# Patient Record
Sex: Male | Born: 1950 | Race: White | Hispanic: No | Smoking: Former smoker
Health system: Southern US, Community
[De-identification: ages and names within clinical notes are randomized; demographics above are authoritative.]

## PROBLEM LIST (undated history)

## (undated) DIAGNOSIS — Q825 Congenital non-neoplastic nevus: Secondary | ICD-10-CM

## (undated) DIAGNOSIS — Z9221 Personal history of antineoplastic chemotherapy: Secondary | ICD-10-CM

## (undated) DIAGNOSIS — C801 Malignant (primary) neoplasm, unspecified: Secondary | ICD-10-CM

## (undated) DIAGNOSIS — E7800 Pure hypercholesterolemia, unspecified: Secondary | ICD-10-CM

## (undated) DIAGNOSIS — M1990 Unspecified osteoarthritis, unspecified site: Secondary | ICD-10-CM

## (undated) DIAGNOSIS — C61 Malignant neoplasm of prostate: Secondary | ICD-10-CM

## (undated) DIAGNOSIS — R519 Headache, unspecified: Secondary | ICD-10-CM

## (undated) DIAGNOSIS — T8859XA Other complications of anesthesia, initial encounter: Secondary | ICD-10-CM

## (undated) DIAGNOSIS — Z889 Allergy status to unspecified drugs, medicaments and biological substances status: Secondary | ICD-10-CM

## (undated) DIAGNOSIS — R51 Headache: Secondary | ICD-10-CM

## (undated) DIAGNOSIS — C8510 Unspecified B-cell lymphoma, unspecified site: Secondary | ICD-10-CM

## (undated) DIAGNOSIS — S8990XA Unspecified injury of unspecified lower leg, initial encounter: Secondary | ICD-10-CM

## (undated) DIAGNOSIS — Z87442 Personal history of urinary calculi: Secondary | ICD-10-CM

## (undated) DIAGNOSIS — T4145XA Adverse effect of unspecified anesthetic, initial encounter: Secondary | ICD-10-CM

## (undated) DIAGNOSIS — R7303 Prediabetes: Secondary | ICD-10-CM

## (undated) HISTORY — DX: Unspecified B-cell lymphoma, unspecified site: C85.10

## (undated) HISTORY — DX: Pure hypercholesterolemia, unspecified: E78.00

---

## 1980-06-11 HISTORY — PX: KNEE ARTHROSCOPY: SUR90

## 1984-06-11 HISTORY — PX: SHOULDER ARTHROSCOPY: SHX128

## 1991-06-12 HISTORY — PX: SHOULDER ARTHROSCOPY: SHX128

## 1995-06-12 HISTORY — PX: ROTATOR CUFF REPAIR: SHX139

## 1999-12-29 ENCOUNTER — Ambulatory Visit (HOSPITAL_COMMUNITY): Admission: RE | Admit: 1999-12-29 | Discharge: 1999-12-29 | Payer: Self-pay | Admitting: *Deleted

## 2000-03-21 ENCOUNTER — Emergency Department (HOSPITAL_COMMUNITY): Admission: EM | Admit: 2000-03-21 | Discharge: 2000-03-21 | Payer: Self-pay | Admitting: Emergency Medicine

## 2000-03-21 ENCOUNTER — Encounter: Payer: Self-pay | Admitting: Emergency Medicine

## 2000-07-14 ENCOUNTER — Encounter: Payer: Self-pay | Admitting: Emergency Medicine

## 2000-07-14 ENCOUNTER — Emergency Department (HOSPITAL_COMMUNITY): Admission: EM | Admit: 2000-07-14 | Discharge: 2000-07-14 | Payer: Self-pay | Admitting: Emergency Medicine

## 2006-06-18 ENCOUNTER — Ambulatory Visit (HOSPITAL_BASED_OUTPATIENT_CLINIC_OR_DEPARTMENT_OTHER): Admission: RE | Admit: 2006-06-18 | Discharge: 2006-06-18 | Payer: Self-pay | Admitting: Orthopedic Surgery

## 2006-12-23 ENCOUNTER — Emergency Department (HOSPITAL_COMMUNITY): Admission: EM | Admit: 2006-12-23 | Discharge: 2006-12-23 | Payer: Self-pay | Admitting: Emergency Medicine

## 2009-06-11 DIAGNOSIS — Z9221 Personal history of antineoplastic chemotherapy: Secondary | ICD-10-CM

## 2009-06-11 HISTORY — DX: Personal history of antineoplastic chemotherapy: Z92.21

## 2009-09-07 ENCOUNTER — Other Ambulatory Visit: Admission: RE | Admit: 2009-09-07 | Discharge: 2009-09-07 | Payer: Self-pay | Admitting: Otolaryngology

## 2009-09-10 ENCOUNTER — Encounter: Admission: RE | Admit: 2009-09-10 | Discharge: 2009-09-10 | Payer: Self-pay | Admitting: Otolaryngology

## 2009-09-10 DIAGNOSIS — C8510 Unspecified B-cell lymphoma, unspecified site: Secondary | ICD-10-CM

## 2009-09-10 HISTORY — DX: Unspecified B-cell lymphoma, unspecified site: C85.10

## 2009-09-14 ENCOUNTER — Ambulatory Visit: Payer: Self-pay | Admitting: Hematology and Oncology

## 2009-09-16 LAB — CBC WITH DIFFERENTIAL/PLATELET
Basophils Absolute: 0.1 10*3/uL (ref 0.0–0.1)
Eosinophils Absolute: 0.1 10*3/uL (ref 0.0–0.5)
HCT: 41 % (ref 38.4–49.9)
HGB: 14.2 g/dL (ref 13.0–17.1)
LYMPH%: 21.8 % (ref 14.0–49.0)
MCV: 93.2 fL (ref 79.3–98.0)
MONO#: 0.5 10*3/uL (ref 0.1–0.9)
MONO%: 8.3 % (ref 0.0–14.0)
NEUT#: 4.4 10*3/uL (ref 1.5–6.5)
Platelets: 344 10*3/uL (ref 140–400)

## 2009-09-16 LAB — COMPREHENSIVE METABOLIC PANEL
Albumin: 3.9 g/dL (ref 3.5–5.2)
Alkaline Phosphatase: 65 U/L (ref 39–117)
BUN: 15 mg/dL (ref 6–23)
CO2: 31 mEq/L (ref 19–32)
Glucose, Bld: 102 mg/dL — ABNORMAL HIGH (ref 70–99)
Total Bilirubin: 0.4 mg/dL (ref 0.3–1.2)

## 2009-09-16 LAB — LACTATE DEHYDROGENASE: LDH: 166 U/L (ref 94–250)

## 2009-09-19 ENCOUNTER — Ambulatory Visit (HOSPITAL_COMMUNITY): Admission: RE | Admit: 2009-09-19 | Discharge: 2009-09-19 | Payer: Self-pay | Admitting: Hematology and Oncology

## 2009-09-20 ENCOUNTER — Ambulatory Visit (HOSPITAL_COMMUNITY): Admission: RE | Admit: 2009-09-20 | Discharge: 2009-09-20 | Payer: Self-pay | Admitting: Hematology and Oncology

## 2009-09-20 ENCOUNTER — Encounter: Payer: Self-pay | Admitting: Hematology and Oncology

## 2009-09-20 LAB — PROTEIN ELECTROPHORESIS, SERUM
Albumin ELP: 56.2 % (ref 55.8–66.1)
Alpha-2-Globulin: 14.8 % — ABNORMAL HIGH (ref 7.1–11.8)
Beta Globulin: 5.8 % (ref 4.7–7.2)
Total Protein, Serum Electrophoresis: 7.2 g/dL (ref 6.0–8.3)

## 2009-09-20 LAB — SEDIMENTATION RATE: Sed Rate: 39 mm/hr — ABNORMAL HIGH (ref 0–16)

## 2009-09-22 ENCOUNTER — Ambulatory Visit (HOSPITAL_COMMUNITY): Admission: RE | Admit: 2009-09-22 | Discharge: 2009-09-22 | Payer: Self-pay | Admitting: Hematology and Oncology

## 2009-09-23 ENCOUNTER — Ambulatory Visit (HOSPITAL_COMMUNITY): Admission: RE | Admit: 2009-09-23 | Discharge: 2009-09-23 | Payer: Self-pay | Admitting: Hematology and Oncology

## 2009-09-27 LAB — HIV ANTIBODY (ROUTINE TESTING W REFLEX)

## 2009-09-27 LAB — CBC WITH DIFFERENTIAL/PLATELET
BASO%: 0.3 % (ref 0.0–2.0)
Eosinophils Absolute: 0.1 10*3/uL (ref 0.0–0.5)
LYMPH%: 14 % (ref 14.0–49.0)
MCHC: 33.9 g/dL (ref 32.0–36.0)
MONO#: 0.4 10*3/uL (ref 0.1–0.9)
NEUT#: 5.8 10*3/uL (ref 1.5–6.5)
Platelets: 399 10*3/uL (ref 140–400)
RBC: 4.74 10*6/uL (ref 4.20–5.82)
WBC: 7.4 10*3/uL (ref 4.0–10.3)
lymph#: 1 10*3/uL (ref 0.9–3.3)

## 2009-09-27 LAB — COMPREHENSIVE METABOLIC PANEL
ALT: 29 U/L (ref 0–53)
Albumin: 4.5 g/dL (ref 3.5–5.2)
CO2: 22 mEq/L (ref 19–32)
Calcium: 9.5 mg/dL (ref 8.4–10.5)
Chloride: 101 mEq/L (ref 96–112)
Glucose, Bld: 89 mg/dL (ref 70–99)
Potassium: 4.5 mEq/L (ref 3.5–5.3)
Sodium: 140 mEq/L (ref 135–145)
Total Protein: 7.3 g/dL (ref 6.0–8.3)

## 2009-09-27 LAB — HEPATITIS B CORE ANTIBODY, TOTAL: Hep B Core Total Ab: NEGATIVE

## 2009-09-27 LAB — URIC ACID: Uric Acid, Serum: 4.1 mg/dL (ref 4.0–7.8)

## 2009-09-27 LAB — HEPATITIS B CORE ANTIBODY, IGM: Hep B C IgM: NEGATIVE

## 2009-09-30 ENCOUNTER — Ambulatory Visit (HOSPITAL_COMMUNITY): Admission: RE | Admit: 2009-09-30 | Discharge: 2009-09-30 | Payer: Self-pay | Admitting: Hematology and Oncology

## 2009-10-03 ENCOUNTER — Ambulatory Visit: Payer: Self-pay | Admitting: Oncology

## 2009-10-06 ENCOUNTER — Ambulatory Visit: Payer: Self-pay | Admitting: Infectious Diseases

## 2009-10-06 ENCOUNTER — Inpatient Hospital Stay (HOSPITAL_COMMUNITY): Admission: AD | Admit: 2009-10-06 | Discharge: 2009-10-10 | Payer: Self-pay | Admitting: Otolaryngology

## 2009-10-08 ENCOUNTER — Ambulatory Visit: Payer: Self-pay | Admitting: Oncology

## 2009-10-18 ENCOUNTER — Ambulatory Visit: Payer: Self-pay | Admitting: Hematology and Oncology

## 2009-10-19 LAB — CBC WITH DIFFERENTIAL/PLATELET
Basophils Absolute: 0 10*3/uL (ref 0.0–0.1)
Eosinophils Absolute: 0 10*3/uL (ref 0.0–0.5)
HGB: 13 g/dL (ref 13.0–17.1)
LYMPH%: 12.5 % — ABNORMAL LOW (ref 14.0–49.0)
MCV: 91.9 fL (ref 79.3–98.0)
MONO%: 7.7 % (ref 0.0–14.0)
NEUT#: 6.1 10*3/uL (ref 1.5–6.5)
Platelets: 466 10*3/uL — ABNORMAL HIGH (ref 140–400)

## 2009-10-19 LAB — COMPREHENSIVE METABOLIC PANEL
Albumin: 4.2 g/dL (ref 3.5–5.2)
Alkaline Phosphatase: 74 U/L (ref 39–117)
BUN: 17 mg/dL (ref 6–23)
Glucose, Bld: 89 mg/dL (ref 70–99)
Total Bilirubin: 0.4 mg/dL (ref 0.3–1.2)

## 2009-10-21 ENCOUNTER — Ambulatory Visit (HOSPITAL_COMMUNITY): Admission: RE | Admit: 2009-10-21 | Discharge: 2009-10-21 | Payer: Self-pay | Admitting: Hematology and Oncology

## 2009-11-03 ENCOUNTER — Ambulatory Visit (HOSPITAL_COMMUNITY): Admission: RE | Admit: 2009-11-03 | Discharge: 2009-11-03 | Payer: Self-pay | Admitting: Hematology and Oncology

## 2009-11-09 LAB — CBC WITH DIFFERENTIAL/PLATELET
BASO%: 0.4 % (ref 0.0–2.0)
EOS%: 0.4 % (ref 0.0–7.0)
Eosinophils Absolute: 0 10*3/uL (ref 0.0–0.5)
LYMPH%: 12.2 % — ABNORMAL LOW (ref 14.0–49.0)
MCH: 31.3 pg (ref 27.2–33.4)
MCHC: 33 g/dL (ref 32.0–36.0)
MCV: 94.9 fL (ref 79.3–98.0)
MONO%: 7.7 % (ref 0.0–14.0)
Platelets: 315 10*3/uL (ref 140–400)
RBC: 3.9 10*6/uL — ABNORMAL LOW (ref 4.20–5.82)
RDW: 16.2 % — ABNORMAL HIGH (ref 11.0–14.6)

## 2009-11-09 LAB — COMPREHENSIVE METABOLIC PANEL
AST: 29 U/L (ref 0–37)
Alkaline Phosphatase: 61 U/L (ref 39–117)
Glucose, Bld: 104 mg/dL — ABNORMAL HIGH (ref 70–99)
Sodium: 141 mEq/L (ref 135–145)
Total Bilirubin: 0.4 mg/dL (ref 0.3–1.2)
Total Protein: 6.5 g/dL (ref 6.0–8.3)

## 2009-11-11 ENCOUNTER — Ambulatory Visit (HOSPITAL_COMMUNITY): Admission: RE | Admit: 2009-11-11 | Discharge: 2009-11-11 | Payer: Self-pay | Admitting: Hematology and Oncology

## 2009-11-28 ENCOUNTER — Ambulatory Visit: Payer: Self-pay | Admitting: Hematology and Oncology

## 2009-11-30 LAB — CBC WITH DIFFERENTIAL/PLATELET
Basophils Absolute: 0 10*3/uL (ref 0.0–0.1)
Eosinophils Absolute: 0.1 10*3/uL (ref 0.0–0.5)
LYMPH%: 12.2 % — ABNORMAL LOW (ref 14.0–49.0)
MCH: 33.2 pg (ref 27.2–33.4)
MCV: 96.2 fL (ref 79.3–98.0)
MONO%: 8.2 % (ref 0.0–14.0)
NEUT#: 4.3 10*3/uL (ref 1.5–6.5)
Platelets: 287 10*3/uL (ref 140–400)
RBC: 3.98 10*6/uL — ABNORMAL LOW (ref 4.20–5.82)

## 2009-11-30 LAB — COMPREHENSIVE METABOLIC PANEL
Alkaline Phosphatase: 51 U/L (ref 39–117)
BUN: 13 mg/dL (ref 6–23)
Glucose, Bld: 138 mg/dL — ABNORMAL HIGH (ref 70–99)
Sodium: 141 mEq/L (ref 135–145)
Total Bilirubin: 0.5 mg/dL (ref 0.3–1.2)

## 2009-12-02 ENCOUNTER — Ambulatory Visit (HOSPITAL_COMMUNITY): Admission: RE | Admit: 2009-12-02 | Discharge: 2009-12-02 | Payer: Self-pay | Admitting: Hematology and Oncology

## 2009-12-21 LAB — CBC WITH DIFFERENTIAL/PLATELET
BASO%: 0.4 % (ref 0.0–2.0)
Eosinophils Absolute: 0.1 10*3/uL (ref 0.0–0.5)
LYMPH%: 10.7 % — ABNORMAL LOW (ref 14.0–49.0)
MCHC: 33.9 g/dL (ref 32.0–36.0)
MCV: 97.7 fL (ref 79.3–98.0)
MONO#: 0.5 10*3/uL (ref 0.1–0.9)
MONO%: 9.6 % (ref 0.0–14.0)
NEUT#: 4 10*3/uL (ref 1.5–6.5)
Platelets: 337 10*3/uL (ref 140–400)
RBC: 3.75 10*6/uL — ABNORMAL LOW (ref 4.20–5.82)
RDW: 17.2 % — ABNORMAL HIGH (ref 11.0–14.6)
WBC: 5.2 10*3/uL (ref 4.0–10.3)

## 2009-12-21 LAB — COMPREHENSIVE METABOLIC PANEL
ALT: 33 U/L (ref 0–53)
Albumin: 4.1 g/dL (ref 3.5–5.2)
Alkaline Phosphatase: 51 U/L (ref 39–117)
Potassium: 4.3 mEq/L (ref 3.5–5.3)
Sodium: 141 mEq/L (ref 135–145)
Total Bilirubin: 0.5 mg/dL (ref 0.3–1.2)
Total Protein: 6.8 g/dL (ref 6.0–8.3)

## 2009-12-23 ENCOUNTER — Ambulatory Visit (HOSPITAL_COMMUNITY): Admission: RE | Admit: 2009-12-23 | Discharge: 2009-12-23 | Payer: Self-pay | Admitting: Oncology

## 2010-01-09 ENCOUNTER — Ambulatory Visit: Payer: Self-pay | Admitting: Hematology and Oncology

## 2010-01-11 LAB — COMPREHENSIVE METABOLIC PANEL
ALT: 27 U/L (ref 0–53)
Albumin: 4.3 g/dL (ref 3.5–5.2)
CO2: 24 mEq/L (ref 19–32)
Glucose, Bld: 141 mg/dL — ABNORMAL HIGH (ref 70–99)
Potassium: 4.3 mEq/L (ref 3.5–5.3)
Sodium: 140 mEq/L (ref 135–145)
Total Bilirubin: 0.4 mg/dL (ref 0.3–1.2)
Total Protein: 6.4 g/dL (ref 6.0–8.3)

## 2010-01-11 LAB — CBC WITH DIFFERENTIAL/PLATELET
BASO%: 0.2 % (ref 0.0–2.0)
Eosinophils Absolute: 0 10*3/uL (ref 0.0–0.5)
LYMPH%: 11.9 % — ABNORMAL LOW (ref 14.0–49.0)
MCHC: 34 g/dL (ref 32.0–36.0)
MONO#: 0.4 10*3/uL (ref 0.1–0.9)
NEUT#: 3.9 10*3/uL (ref 1.5–6.5)
RBC: 3.74 10*6/uL — ABNORMAL LOW (ref 4.20–5.82)
RDW: 15.1 % — ABNORMAL HIGH (ref 11.0–14.6)
WBC: 4.9 10*3/uL (ref 4.0–10.3)
lymph#: 0.6 10*3/uL — ABNORMAL LOW (ref 0.9–3.3)

## 2010-01-13 ENCOUNTER — Ambulatory Visit (HOSPITAL_COMMUNITY): Admission: RE | Admit: 2010-01-13 | Discharge: 2010-01-13 | Payer: Self-pay | Admitting: Hematology and Oncology

## 2010-02-08 ENCOUNTER — Ambulatory Visit (HOSPITAL_COMMUNITY): Admission: RE | Admit: 2010-02-08 | Discharge: 2010-02-08 | Payer: Self-pay | Admitting: Hematology and Oncology

## 2010-02-09 ENCOUNTER — Ambulatory Visit: Payer: Self-pay | Admitting: Hematology and Oncology

## 2010-02-09 LAB — CBC WITH DIFFERENTIAL/PLATELET
EOS%: 1.5 % (ref 0.0–7.0)
Eosinophils Absolute: 0.1 10*3/uL (ref 0.0–0.5)
LYMPH%: 12.4 % — ABNORMAL LOW (ref 14.0–49.0)
MCH: 33.8 pg — ABNORMAL HIGH (ref 27.2–33.4)
MCHC: 34.1 g/dL (ref 32.0–36.0)
MCV: 99.2 fL — ABNORMAL HIGH (ref 79.3–98.0)
MONO%: 10.7 % (ref 0.0–14.0)
NEUT#: 2.8 10*3/uL (ref 1.5–6.5)
Platelets: 290 10*3/uL (ref 140–400)
RBC: 3.78 10*6/uL — ABNORMAL LOW (ref 4.20–5.82)
RDW: 14.3 % (ref 11.0–14.6)

## 2010-02-09 LAB — COMPREHENSIVE METABOLIC PANEL
AST: 41 U/L — ABNORMAL HIGH (ref 0–37)
Alkaline Phosphatase: 53 U/L (ref 39–117)
Glucose, Bld: 115 mg/dL — ABNORMAL HIGH (ref 70–99)
Sodium: 142 mEq/L (ref 135–145)
Total Bilirubin: 0.4 mg/dL (ref 0.3–1.2)
Total Protein: 6.4 g/dL (ref 6.0–8.3)

## 2010-02-09 LAB — LACTATE DEHYDROGENASE: LDH: 179 U/L (ref 94–250)

## 2010-05-08 ENCOUNTER — Ambulatory Visit: Payer: Self-pay | Admitting: Hematology and Oncology

## 2010-05-08 ENCOUNTER — Ambulatory Visit (HOSPITAL_COMMUNITY)
Admission: RE | Admit: 2010-05-08 | Discharge: 2010-05-08 | Payer: Self-pay | Source: Home / Self Care | Admitting: Hematology and Oncology

## 2010-05-08 LAB — LACTATE DEHYDROGENASE: LDH: 154 U/L (ref 94–250)

## 2010-05-08 LAB — COMPREHENSIVE METABOLIC PANEL
Albumin: 4.2 g/dL (ref 3.5–5.2)
Alkaline Phosphatase: 64 U/L (ref 39–117)
CO2: 25 mEq/L (ref 19–32)
Calcium: 9.5 mg/dL (ref 8.4–10.5)
Chloride: 104 mEq/L (ref 96–112)
Glucose, Bld: 96 mg/dL (ref 70–99)
Potassium: 4.1 mEq/L (ref 3.5–5.3)
Sodium: 138 mEq/L (ref 135–145)
Total Protein: 7.2 g/dL (ref 6.0–8.3)

## 2010-05-08 LAB — CBC WITH DIFFERENTIAL/PLATELET
Basophils Absolute: 0 10*3/uL (ref 0.0–0.1)
Eosinophils Absolute: 0.1 10*3/uL (ref 0.0–0.5)
HGB: 14.4 g/dL (ref 13.0–17.1)
MONO#: 0.5 10*3/uL (ref 0.1–0.9)
NEUT#: 3 10*3/uL (ref 1.5–6.5)
RBC: 4.45 10*6/uL (ref 4.20–5.82)
RDW: 13.2 % (ref 11.0–14.6)
WBC: 4.4 10*3/uL (ref 4.0–10.3)
lymph#: 0.8 10*3/uL — ABNORMAL LOW (ref 0.9–3.3)

## 2010-06-30 ENCOUNTER — Other Ambulatory Visit: Payer: Self-pay | Admitting: Hematology and Oncology

## 2010-06-30 DIAGNOSIS — C8580 Other specified types of non-Hodgkin lymphoma, unspecified site: Secondary | ICD-10-CM

## 2010-07-01 ENCOUNTER — Encounter: Payer: Self-pay | Admitting: Hematology and Oncology

## 2010-08-25 LAB — GLUCOSE, CAPILLARY: Glucose-Capillary: 113 mg/dL — ABNORMAL HIGH (ref 70–99)

## 2010-08-28 LAB — GLUCOSE, CAPILLARY: Glucose-Capillary: 113 mg/dL — ABNORMAL HIGH (ref 70–99)

## 2010-08-29 LAB — GRAM STAIN

## 2010-08-29 LAB — CBC
HCT: 33.5 % — ABNORMAL LOW (ref 39.0–52.0)
HCT: 33.5 % — ABNORMAL LOW (ref 39.0–52.0)
HCT: 36.7 % — ABNORMAL LOW (ref 39.0–52.0)
Hemoglobin: 11.5 g/dL — ABNORMAL LOW (ref 13.0–17.0)
MCHC: 35 g/dL (ref 30.0–36.0)
MCHC: 34.7 g/dL (ref 30.0–36.0)
MCHC: 35 g/dL (ref 30.0–36.0)
MCV: 92.7 fL (ref 78.0–100.0)
MCV: 92.7 fL (ref 78.0–100.0)
Platelets: 245 10*3/uL (ref 150–400)
RBC: 3.58 MIL/uL — ABNORMAL LOW (ref 4.22–5.81)
RBC: 3.62 MIL/uL — ABNORMAL LOW (ref 4.22–5.81)
RBC: 3.58 MIL/uL — ABNORMAL LOW (ref 4.22–5.81)
RBC: 3.75 MIL/uL — ABNORMAL LOW (ref 4.22–5.81)
RDW: 13.1 % (ref 11.5–15.5)
RDW: 13.2 % (ref 11.5–15.5)
WBC: 7.8 10*3/uL (ref 4.0–10.5)
WBC: 4.8 10*3/uL (ref 4.0–10.5)

## 2010-08-29 LAB — COMPREHENSIVE METABOLIC PANEL
ALT: 184 U/L — ABNORMAL HIGH (ref 0–53)
AST: 89 U/L — ABNORMAL HIGH (ref 0–37)
Albumin: 3.5 g/dL (ref 3.5–5.2)
BUN: 11 mg/dL (ref 6–23)
CO2: 28 mEq/L (ref 19–32)
Calcium: 8.2 mg/dL — ABNORMAL LOW (ref 8.4–10.5)
Calcium: 9.1 mg/dL (ref 8.4–10.5)
Chloride: 101 mEq/L (ref 96–112)
Creatinine, Ser: 0.93 mg/dL (ref 0.4–1.5)
GFR calc Af Amer: >60 mL/min (ref >60)
GFR calc Af Amer: >60 mL/min (ref >60)
GFR calc non Af Amer: >60 mL/min (ref >60)
Sodium: 134 mEq/L — ABNORMAL LOW (ref 135–145)
Total Bilirubin: 0.4 mg/dL (ref 0.3–1.2)
Total Bilirubin: 0.7 mg/dL (ref 0.3–1.2)
Total Protein: 7 g/dL (ref 6.0–8.3)

## 2010-08-29 LAB — WOUND CULTURE: Gram Stain: NONE SEEN

## 2010-08-29 LAB — CULTURE, BLOOD (ROUTINE X 2)
Culture: NO GROWTH
Culture: NO GROWTH

## 2010-08-29 LAB — DIFFERENTIAL
Basophils Absolute: 0 10*3/uL (ref 0.0–0.1)
Basophils Relative: 0 % (ref 0–1)
Basophils Relative: 1 % (ref 0–1)
Eosinophils Absolute: 0 10*3/uL (ref 0.0–0.7)
Eosinophils Relative: 1 % (ref 0–5)
Lymphocytes Relative: 13 % (ref 12–46)
Monocytes Relative: 10 % (ref 3–12)
Monocytes Relative: 5 % (ref 3–12)
Neutro Abs: 1.9 10*3/uL (ref 1.7–7.7)
Neutrophils Relative %: 66 % (ref 43–77)
Neutrophils Relative %: 81 % — ABNORMAL HIGH (ref 43–77)

## 2010-08-29 LAB — URINALYSIS, ROUTINE W REFLEX MICROSCOPIC
Nitrite: NEGATIVE
Specific Gravity, Urine: 1.016 (ref 1.005–1.030)
Urobilinogen, UA: 1 mg/dL (ref 0.0–1.0)

## 2010-08-29 LAB — CULTURE, ROUTINE-SINUS

## 2010-08-30 LAB — PROTIME-INR: INR: 1.07 (ref 0.00–1.49)

## 2010-08-30 LAB — CBC
Hemoglobin: 14.6 g/dL (ref 13.0–17.0)
MCHC: 34.2 g/dL (ref 30.0–36.0)
MCV: 93.4 fL (ref 78.0–100.0)
RDW: 12.8 % (ref 11.5–15.5)

## 2010-09-11 ENCOUNTER — Other Ambulatory Visit (HOSPITAL_COMMUNITY): Payer: Self-pay

## 2010-09-11 ENCOUNTER — Other Ambulatory Visit: Payer: Self-pay | Admitting: Hematology and Oncology

## 2010-09-11 ENCOUNTER — Encounter (HOSPITAL_BASED_OUTPATIENT_CLINIC_OR_DEPARTMENT_OTHER): Payer: PRIVATE HEALTH INSURANCE | Admitting: Hematology and Oncology

## 2010-09-11 ENCOUNTER — Ambulatory Visit (HOSPITAL_COMMUNITY)
Admission: RE | Admit: 2010-09-11 | Discharge: 2010-09-11 | Disposition: A | Discharge status: Home / Self Care | Payer: PRIVATE HEALTH INSURANCE | Source: Ambulatory Visit | Attending: Hematology and Oncology | Admitting: Hematology and Oncology

## 2010-09-11 ENCOUNTER — Encounter (HOSPITAL_COMMUNITY): Payer: Self-pay

## 2010-09-11 DIAGNOSIS — C8581 Other specified types of non-Hodgkin lymphoma, lymph nodes of head, face, and neck: Secondary | ICD-10-CM

## 2010-09-11 DIAGNOSIS — N400 Enlarged prostate without lower urinary tract symptoms: Secondary | ICD-10-CM | POA: Insufficient documentation

## 2010-09-11 DIAGNOSIS — N62 Hypertrophy of breast: Secondary | ICD-10-CM | POA: Insufficient documentation

## 2010-09-11 DIAGNOSIS — Z09 Encounter for follow-up examination after completed treatment for conditions other than malignant neoplasm: Secondary | ICD-10-CM | POA: Insufficient documentation

## 2010-09-11 DIAGNOSIS — Z23 Encounter for immunization: Secondary | ICD-10-CM

## 2010-09-11 DIAGNOSIS — R911 Solitary pulmonary nodule: Secondary | ICD-10-CM | POA: Insufficient documentation

## 2010-09-11 DIAGNOSIS — C8580 Other specified types of non-Hodgkin lymphoma, unspecified site: Secondary | ICD-10-CM

## 2010-09-11 DIAGNOSIS — C8589 Other specified types of non-Hodgkin lymphoma, extranodal and solid organ sites: Secondary | ICD-10-CM | POA: Insufficient documentation

## 2010-09-11 HISTORY — DX: Malignant (primary) neoplasm, unspecified: C80.1

## 2010-09-11 LAB — CMP (CANCER CENTER ONLY)
AST: 32 U/L (ref 11–38)
Alkaline Phosphatase: 59 U/L (ref 26–84)
Glucose, Bld: 113 mg/dL (ref 73–118)
Sodium: 141 mEq/L (ref 128–145)
Total Bilirubin: 0.7 mg/dl (ref 0.20–1.60)
Total Protein: 6.7 g/dL (ref 6.4–8.1)

## 2010-09-11 LAB — CBC WITH DIFFERENTIAL/PLATELET
Basophils Absolute: 0 10*3/uL (ref 0.0–0.1)
Eosinophils Absolute: 0.1 10*3/uL (ref 0.0–0.5)
HCT: 39.7 % (ref 38.4–49.9)
HGB: 13.7 g/dL (ref 13.0–17.1)
LYMPH%: 20.4 % (ref 14.0–49.0)
MCHC: 34.5 g/dL (ref 32.0–36.0)
MONO#: 0.3 10*3/uL (ref 0.1–0.9)
NEUT#: 2.3 10*3/uL (ref 1.5–6.5)
NEUT%: 67 % (ref 39.0–75.0)
Platelets: 248 10*3/uL (ref 140–400)
WBC: 3.4 10*3/uL — ABNORMAL LOW (ref 4.0–10.3)
lymph#: 0.7 10*3/uL — ABNORMAL LOW (ref 0.9–3.3)

## 2010-09-11 MED ORDER — IOHEXOL 300 MG/ML  SOLN
125.0000 mL | Freq: Once | INTRAMUSCULAR | Status: AC | PRN
  Administered 2010-09-11: 125 mL via INTRAVENOUS

## 2010-09-12 ENCOUNTER — Encounter (HOSPITAL_BASED_OUTPATIENT_CLINIC_OR_DEPARTMENT_OTHER): Payer: PRIVATE HEALTH INSURANCE | Admitting: Hematology and Oncology

## 2010-09-12 ENCOUNTER — Other Ambulatory Visit: Payer: Self-pay | Admitting: Hematology and Oncology

## 2010-09-12 DIAGNOSIS — C8581 Other specified types of non-Hodgkin lymphoma, lymph nodes of head, face, and neck: Secondary | ICD-10-CM

## 2010-09-12 DIAGNOSIS — C8590 Non-Hodgkin lymphoma, unspecified, unspecified site: Secondary | ICD-10-CM

## 2010-10-27 NOTE — Procedures (Signed)
[place]  Patient:    Ian Park, Ian Park                        MRN: [id number] Proc. Date: 12/29/99 Adm. Date:  [id number] Attending:  Mingo Amber CC:         Anna Genre Little, M.D.                           Procedure Report  PROCEDURES:  Video colonoscopy.  INDICATIONS:  A 60 year old male with recurrent hematochezia and rectal discomfort.  PREPARATION:  Patient is n.p.o. since midnight, having taken Phospho-Soda prep and a clear liquid diet yesterday.  The mucosa throughout was clean.  Depth of insertion: cecum.  PREPROCEDURE SEDATION:  He received a total of 50 mg of Demerol and 5 mg of Versed intravenously.  In addition, he was on 2 L of nasal cannula O2.  PROCEDURE:  The Olympus video colonoscope was inserted via the rectum and advanced easily through the entire colon to the cecum.  Cecal landmarks were identified and photographed.  On withdrawal, the mucosa was carefully evaluated and found to be entirely normal from cecum to rectum.  Retroflexed view of the rectum was unremarkable.  On withdrawal through the anal canal, where moderate external hemorrhoids were noted.  There was no other pathology seen.  Patient tolerated the procedure well.  Pulse, blood pressure and oximetry testing were stable throughout.  He was observed in recovery for 45 minutes and discharged home alert with a benign abdomen.  IMPRESSION:  Rectal bleeding seemed secondary only to external hemorrhoids, which should be managed symptomatically, unless the patients discomfort worsens significantly.  A high-fiber diet, Sitz baths p.r.n., Anusol cream or suppositories and Tucks pads were recommended. DD:  12/29/99 TD:  12/30/99 Job: [id number]

## 2010-10-27 NOTE — Op Note (Signed)
NAME:  Ian Park, Ian Park                 ACCOUNT NO.:  [id number]   MEDICAL RECORD NO.:  [id number]          PATIENT TYPE:  AMB   LOCATION:  DSC                          FACILITY:  [facility]   PHYSICIAN:  Katy Fitch. Sypher, M.D. DATE OF BIRTH:  July 06, 1950   DATE OF PROCEDURE:  06/18/2006  DATE OF DISCHARGE:                               OPERATIVE REPORT   PREOPERATIVE DIAGNOSIS:  Chronic pain right shoulder due to a number of  factors, including acromioclavicular arthritis and sub-acromioclavicular  joint impingement, with chronic bursitis, rule out recurrent rotator  cuff tear.   POSTOPERATIVE DIAGNOSES:  1. Extensive labral degenerative tearing and loose body within      glenohumeral joint, and early adhesive capsulitis right shoulder,      as well as sub-acromioclavicular  impingement.  2. There was no sign of a recurrent rotator cuff tear.   OPERATIONS:  1. Examination of right shoulder under anesthesia.  2. Arthroscopic debridement of labral tear, removal of loose body, and      debridement of adhesive capsulitis granulation tissue.  3. Subacromial debridement, with tenolysis of rotator cuff tendons.      subacromial decompression, with acromioplasty, coracoacromial      ligament relaxation.  4. Arthroscopic removal of distal clavicle.   OPERATING SURGEON:  Katy Fitch. Sypher, M.D.   ASSISTANT:  Marveen Reeks. Dasnoit PA-C.   ANESTHESIA:  General endotracheal, supplemented by interscalene block.   SUPERVISING ANESTHESIOLOGIST:  Germaine Pomfret, M.D.   INDICATIONS:  Ian Park is a 60 year old gentleman referred through  the courtesy of Dr. Catha Gosselin for evaluation and management of a  painful right shoulder.   Ian Park had undergone prior rotator cuff repair by Dr. Chaney Malling on the  left in 1986, on the right in 1994.   His left shoulder was still reasonably comfortable.  His right shoulder  had become increasingly painful.  He presented for an upper extremity  orthopedic evaluation.  Clinical examination revealed mild anterior  deltoid atrophy, perhaps due to postoperative technical issues.   He had AC degenerative change with a prominent inferior clavicle and a  prominent medial acromion.   He had had a generous anterior lateral acromioplasty performed at the  time of his prior rotator cuff repair.   Ian Park was sent for an MRI which revealed evidence of some labral  degenerative changes, AC arthropathy, a prominent distal clavicle, and  abnormal signal in the rotator cuff.   We advised him to proceed with examination of his shoulder under  anesthesia, anticipating arthroscopic evaluation of the rotator cuff,  appropriate intraarticular debridement, subacromial decompression,  medial acromial removal, and distal clavicle resection.   After informed consent, he is brought to the operating room at this  time.   PROCEDURE:  Ian Park was brought to the operating room and placed  in a supine position on the operating table.  Dr. Jean Rosenthal had performed  an anesthesia consult in the holding area.  An interscalene block was  placed without complication by Dr. Jean Rosenthal.   Following the induction of general endotracheal anesthesia under  Dr.  Edison Pace direct supervision, Ian Park was carefully positioned in the  beach-chair position with aid of a torso and head holder designed for  shoulder arthroscopy.  Pneumatic compression devices were applied to the  calves for deep vein thrombosis prevention, and the entire right upper  extremity was prepped with DuraPrep and draped with impervious  arthroscopy drapes.   Examination of the shoulder under anesthesia revealed elevation 175  degrees combined, external rotation 90 degrees, internal rotation of 80  degrees. The glenohumeral joint was stable.  There appeared to be sub-AC  impingement, with a click noted.   The arm was then prepped DuraPrep and draped with impervious arthroscopy  drapes.   The shoulder was distended with 20 cc of sterile saline with a  spinal needle brought in posteriorly.  The scope was placed  atraumatically through the posterior soft spot.  Diagnostic arthroscopy  revealed extensive fraying of the labrum and granulation tissue in the  subscapularis recess, consistent with an early adhesive capsulitis.  There was a Buford complex type the anterior superior glenohumeral  ligament.  This was obliterated with the granulation tissue and frayed  fragments of capsule and hypertrophic synovitis.   An anterior portal was created under direct vision with a blunt trocar,  followed by use of a 4.5 mm suction shaver to debride the labrum to a  stable margin.  A 6 x 5 mm loose body was identified that was ultimately  removed by suction from the joint with the suction shaver.  A complete  synovectomy was accomplished, followed by inspection of the biceps  origin.  This was noted be stable at the glenoid, and the biceps was  intact through the rotator interval.  The subscapularis had some  degenerative tendinopathy.  The humeral head had areas of grade 3  chondromalacia.  The inferior labrum was degenerative, and the inferior  glenoid had an area of full-thickness chondromalacia.   Several fragments of the labrum were removed with the suction shaver.  The inferior recess was inspected and found be slightly inflamed but  otherwise normal.   The prior rotator cuff repair was inspected.  The footprint of the  supraspinatus and infraspinatus was not normal on the greater  tuberosity.  There was a recess of approximately 5-6 mm where the repair  was not anatomic with respect its medial footprint. However, the tendon  appeared to be adequately attached to the greater tuberosity to avoid a  second effort at repair.   After removal of the scope from the glenohumeral joint, the scope was placed in the subacromial space.  A florid bursitis obscured view.  After bursectomy,  the anatomy of the coracoacromial arch was studied.  The medial acromion and distal clavicle were quite prominent.  There had  been a prior generous anterior acromionectomy. However, there was still  small anterior lip within the deltoid.  After the bursa was taken down,  exposing the deltoid insertion anteriorly, the suction shaver and bur  were used to the level of the acromion to a type 1 morphology.  The  medial acromion at the [facility] joint was leveled to a type 1 morphology, and  the distal 15 mm of clavicle was removed arthroscopically.   A dense scar at the site of the reformed coracoacromial ligament was  released with the cutting cautery and partially debrided with the  suction shaver.   A complete tenolysis of the rotator cuff was accomplished, removing  hypertrophic bursa and remnants of scar  on the deep surface of the  acromion.   After complete decompression, it was my judgment that repair of the  rotator cuff would not be necessary.  Hemostasis was achieved with  bipolar cautery, and the periosteum along the margin of the distal  clavicle resection was electrocauterized with the cutting cautery.   The arthroscopic equipment was removed after photographic documentation  of the decompression.  There were no apparent complications.   Ian Park tolerated the surgery and anesthesia well.   He was awakened from general anesthesia and transferred to the recovery  room with stable signs vital signs, with a sling supporting the right  arm.   If Ian Park is noted to be comfortable and stable in the postanesthesia  care unit, he will be discharged home and may return to our office in 24  hours for dressing change and initiation of his range of motion program.      Katy Fitch. Sypher, M.D.  Electronically Signed     RVS/MEDQ  D:  06/18/2006  T:  06/18/2006  Job:  [id number]   cc:   Caryn Bee L. Little, M.D.

## 2010-11-02 ENCOUNTER — Other Ambulatory Visit: Payer: Self-pay | Admitting: Family Medicine

## 2010-11-02 DIAGNOSIS — N644 Mastodynia: Secondary | ICD-10-CM

## 2010-11-07 ENCOUNTER — Ambulatory Visit
Admission: RE | Admit: 2010-11-07 | Discharge: 2010-11-07 | Disposition: A | Discharge status: Home / Self Care | Payer: PRIVATE HEALTH INSURANCE | Source: Ambulatory Visit | Attending: Family Medicine | Admitting: Family Medicine

## 2010-11-07 ENCOUNTER — Other Ambulatory Visit: Payer: Self-pay | Admitting: Family Medicine

## 2010-11-07 DIAGNOSIS — N644 Mastodynia: Secondary | ICD-10-CM

## 2011-01-19 ENCOUNTER — Inpatient Hospital Stay (HOSPITAL_COMMUNITY)
Admission: RE | Admit: 2011-01-19 | Discharge: 2011-01-19 | Payer: PRIVATE HEALTH INSURANCE | Source: Ambulatory Visit | Attending: Hematology and Oncology | Admitting: Hematology and Oncology

## 2011-01-22 ENCOUNTER — Encounter (HOSPITAL_COMMUNITY): Payer: Self-pay

## 2011-01-22 ENCOUNTER — Ambulatory Visit (HOSPITAL_COMMUNITY)
Admission: RE | Admit: 2011-01-22 | Discharge: 2011-01-22 | Disposition: A | Discharge status: Home / Self Care | Payer: PRIVATE HEALTH INSURANCE | Source: Ambulatory Visit | Attending: Hematology and Oncology | Admitting: Hematology and Oncology

## 2011-01-22 ENCOUNTER — Encounter (HOSPITAL_BASED_OUTPATIENT_CLINIC_OR_DEPARTMENT_OTHER): Payer: PRIVATE HEALTH INSURANCE | Admitting: Hematology and Oncology

## 2011-01-22 ENCOUNTER — Other Ambulatory Visit: Payer: Self-pay | Admitting: Hematology and Oncology

## 2011-01-22 DIAGNOSIS — R911 Solitary pulmonary nodule: Secondary | ICD-10-CM | POA: Insufficient documentation

## 2011-01-22 DIAGNOSIS — C8581 Other specified types of non-Hodgkin lymphoma, lymph nodes of head, face, and neck: Secondary | ICD-10-CM

## 2011-01-22 DIAGNOSIS — Z9221 Personal history of antineoplastic chemotherapy: Secondary | ICD-10-CM | POA: Insufficient documentation

## 2011-01-22 DIAGNOSIS — C8589 Other specified types of non-Hodgkin lymphoma, extranodal and solid organ sites: Secondary | ICD-10-CM | POA: Insufficient documentation

## 2011-01-22 DIAGNOSIS — N400 Enlarged prostate without lower urinary tract symptoms: Secondary | ICD-10-CM | POA: Insufficient documentation

## 2011-01-22 DIAGNOSIS — K5730 Diverticulosis of large intestine without perforation or abscess without bleeding: Secondary | ICD-10-CM | POA: Insufficient documentation

## 2011-01-22 DIAGNOSIS — N200 Calculus of kidney: Secondary | ICD-10-CM | POA: Insufficient documentation

## 2011-01-22 DIAGNOSIS — C8590 Non-Hodgkin lymphoma, unspecified, unspecified site: Secondary | ICD-10-CM

## 2011-01-22 LAB — CBC WITH DIFFERENTIAL/PLATELET
Eosinophils Absolute: 0.1 10*3/uL (ref 0.0–0.5)
HCT: 42.2 % (ref 38.4–49.9)
LYMPH%: 22.9 % (ref 14.0–49.0)
MCV: 93.9 fL (ref 79.3–98.0)
MONO#: 0.4 10*3/uL (ref 0.1–0.9)
NEUT#: 3.2 10*3/uL (ref 1.5–6.5)
NEUT%: 67.2 % (ref 39.0–75.0)
Platelets: 284 10*3/uL (ref 140–400)
WBC: 4.7 10*3/uL (ref 4.0–10.3)

## 2011-01-22 LAB — CMP (CANCER CENTER ONLY)
BUN, Bld: 13 mg/dL (ref 7–22)
CO2: 28 mEq/L (ref 18–33)
Creat: 0.9 mg/dl (ref 0.6–1.2)
Glucose, Bld: 107 mg/dL (ref 73–118)
Total Bilirubin: 0.8 mg/dl (ref 0.20–1.60)

## 2011-01-22 LAB — PSA: PSA: 3.76 ng/mL (ref <=4.00)

## 2011-01-22 LAB — LACTATE DEHYDROGENASE: LDH: 164 U/L (ref 94–250)

## 2011-01-22 MED ORDER — IOHEXOL 300 MG/ML  SOLN
100.0000 mL | Freq: Once | INTRAMUSCULAR | Status: AC | PRN
  Administered 2011-01-22: 100 mL via INTRAVENOUS

## 2011-01-23 ENCOUNTER — Encounter (HOSPITAL_BASED_OUTPATIENT_CLINIC_OR_DEPARTMENT_OTHER): Payer: PRIVATE HEALTH INSURANCE | Admitting: Hematology and Oncology

## 2011-01-23 ENCOUNTER — Other Ambulatory Visit: Payer: Self-pay | Admitting: Hematology and Oncology

## 2011-01-23 DIAGNOSIS — C8590 Non-Hodgkin lymphoma, unspecified, unspecified site: Secondary | ICD-10-CM

## 2011-01-23 DIAGNOSIS — C8581 Other specified types of non-Hodgkin lymphoma, lymph nodes of head, face, and neck: Secondary | ICD-10-CM

## 2011-02-23 ENCOUNTER — Encounter (HOSPITAL_BASED_OUTPATIENT_CLINIC_OR_DEPARTMENT_OTHER): Payer: PRIVATE HEALTH INSURANCE | Admitting: Hematology and Oncology

## 2011-02-23 ENCOUNTER — Other Ambulatory Visit: Payer: Self-pay | Admitting: Hematology and Oncology

## 2011-02-23 DIAGNOSIS — C8581 Other specified types of non-Hodgkin lymphoma, lymph nodes of head, face, and neck: Secondary | ICD-10-CM

## 2011-02-23 DIAGNOSIS — N644 Mastodynia: Secondary | ICD-10-CM

## 2011-02-23 DIAGNOSIS — C8589 Other specified types of non-Hodgkin lymphoma, extranodal and solid organ sites: Secondary | ICD-10-CM

## 2011-02-23 LAB — CBC WITH DIFFERENTIAL/PLATELET
Basophils Absolute: 0 10*3/uL (ref 0.0–0.1)
EOS%: 2.4 % (ref 0.0–7.0)
Eosinophils Absolute: 0.1 10*3/uL (ref 0.0–0.5)
HGB: 14.3 g/dL (ref 13.0–17.1)
MCH: 32.4 pg (ref 27.2–33.4)
NEUT#: 2.6 10*3/uL (ref 1.5–6.5)
RDW: 13.2 % (ref 11.0–14.6)
WBC: 3.8 10*3/uL — ABNORMAL LOW (ref 4.0–10.3)
lymph#: 0.8 10*3/uL — ABNORMAL LOW (ref 0.9–3.3)

## 2011-02-23 LAB — COMPREHENSIVE METABOLIC PANEL
AST: 20 U/L (ref 0–37)
Albumin: 4.2 g/dL (ref 3.5–5.2)
BUN: 17 mg/dL (ref 6–23)
Calcium: 9.6 mg/dL (ref 8.4–10.5)
Chloride: 107 mEq/L (ref 96–112)
Potassium: 4.2 mEq/L (ref 3.5–5.3)

## 2011-04-11 ENCOUNTER — Other Ambulatory Visit: Payer: Self-pay | Admitting: Gastroenterology

## 2011-05-19 ENCOUNTER — Encounter: Payer: Self-pay | Admitting: *Deleted

## 2011-05-21 ENCOUNTER — Other Ambulatory Visit: Payer: Self-pay | Admitting: Hematology and Oncology

## 2011-05-21 ENCOUNTER — Ambulatory Visit (HOSPITAL_COMMUNITY)
Admission: RE | Admit: 2011-05-21 | Discharge: 2011-05-21 | Disposition: A | Discharge status: Home / Self Care | Payer: No Typology Code available for payment source | Source: Ambulatory Visit | Attending: Hematology and Oncology | Admitting: Hematology and Oncology

## 2011-05-21 ENCOUNTER — Other Ambulatory Visit (HOSPITAL_BASED_OUTPATIENT_CLINIC_OR_DEPARTMENT_OTHER): Payer: PRIVATE HEALTH INSURANCE | Admitting: Lab

## 2011-05-21 DIAGNOSIS — C8589 Other specified types of non-Hodgkin lymphoma, extranodal and solid organ sites: Secondary | ICD-10-CM | POA: Insufficient documentation

## 2011-05-21 DIAGNOSIS — C8581 Other specified types of non-Hodgkin lymphoma, lymph nodes of head, face, and neck: Secondary | ICD-10-CM

## 2011-05-21 DIAGNOSIS — C8590 Non-Hodgkin lymphoma, unspecified, unspecified site: Secondary | ICD-10-CM

## 2011-05-21 DIAGNOSIS — Z9221 Personal history of antineoplastic chemotherapy: Secondary | ICD-10-CM | POA: Insufficient documentation

## 2011-05-21 LAB — CBC WITH DIFFERENTIAL/PLATELET
BASO%: 0.7 % (ref 0.0–2.0)
EOS%: 1.4 % (ref 0.0–7.0)
LYMPH%: 17.4 % (ref 14.0–49.0)
MCHC: 34.3 g/dL (ref 32.0–36.0)
MCV: 93.9 fL (ref 79.3–98.0)
MONO%: 6.2 % (ref 0.0–14.0)
Platelets: 255 10*3/uL (ref 140–400)
RBC: 4.49 10*6/uL (ref 4.20–5.82)

## 2011-05-21 LAB — CMP (CANCER CENTER ONLY)
ALT(SGPT): 25 U/L (ref 10–47)
AST: 24 U/L (ref 11–38)
Creat: 1.2 mg/dl (ref 0.6–1.2)
Total Bilirubin: 0.6 mg/dl (ref 0.20–1.60)

## 2011-05-21 LAB — LACTATE DEHYDROGENASE: LDH: 179 U/L (ref 94–250)

## 2011-05-21 MED ORDER — IOHEXOL 300 MG/ML  SOLN
100.0000 mL | Freq: Once | INTRAMUSCULAR | Status: AC | PRN
  Administered 2011-05-21: 100 mL via INTRAVENOUS

## 2011-05-22 ENCOUNTER — Ambulatory Visit (HOSPITAL_BASED_OUTPATIENT_CLINIC_OR_DEPARTMENT_OTHER): Payer: PRIVATE HEALTH INSURANCE | Admitting: Hematology and Oncology

## 2011-05-22 ENCOUNTER — Telehealth: Payer: Self-pay | Admitting: Hematology and Oncology

## 2011-05-22 VITALS — BP 129/78 | HR 75 | Temp 98.4°F | Ht 67.0 in | Wt 229.8 lb

## 2011-05-22 DIAGNOSIS — C8581 Other specified types of non-Hodgkin lymphoma, lymph nodes of head, face, and neck: Secondary | ICD-10-CM

## 2011-05-22 DIAGNOSIS — C8590 Non-Hodgkin lymphoma, unspecified, unspecified site: Secondary | ICD-10-CM

## 2011-05-22 NOTE — Progress Notes (Signed)
CC:   Ian Park. Little, M.D. Jefry H. Pollyann Kennedy, MD  IDENTIFYING STATEMENT:  Patient is a 60 year old man with large B-cell lymphoma who presents for followup to discuss results of scans.  INTERIM HISTORY:  Ian Park has last seen in August 2012.  He has no current concerns.  He has been extensively worked up for bilateral gynecomastia.  He has no current complaints.  Denies fever, chills, night sweats, anorexia, weight loss.  He has not noted any adenopathy. He reminded me that he received a colonoscopy in September of 2012 that was unremarkable.  MEDICATIONS:  Reviewed and updated.  ALLERGIES:  None.  PAST MEDICAL HISTORY/FAMILY HISTORY/SOCIAL HISTORY:  Unchanged.  REVIEW OF SYSTEMS:  10 point review of systems negative.  PHYSICAL EXAMINATION:  The patient is a well-appearing, well-nourished man in no distress.  Vitals:  Pulse 75, blood pressure 129/78, temperature 98.4, respirations 18, weight 229.8 pounds.  HEENT:  Head is atraumatic, normocephalic.  Sclerae anicteric.  Neck:  Supple.  No adenopathy.  Chest:  Clear to percussion and auscultation.  CVS:  1st and 2nd heart sounds present.  No added sounds or murmurs.  Bilateral gynecomastia with no masses.  Abdomen:  Soft, nontender.  Bowel sounds present.  Lymph nodes:  No palpable cervical, axillary, inguinal adenopathy.  Extremities:  No calf tenderness.  Pulses present and symmetrical.  CNS:  Nonfocal.  LABORATORY DATA:  05/21/2011 white cell count 4.5, hemoglobin 14.4, hematocrit 40.1, platelets 255.  Sodium 143, potassium 4.3, chloride 102, CO2 29, BUN 12, creatinine 1.2, glucose 113, t bilirubin 0.6, alkaline phosphatase 79, AST 24, ALT 25, calcium 8.7, LDH 179.  CT scan of the neck, chest, abdomen, and pelvis obtained on 05/21/2011 showed no evidence of lymphoma recurrence within the neck.  The chest and abdomen also showed no evidence of lymphoma recurrence.  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  He is status post 6 cycles of R-CHOP alternating with intrathecal methotrexate and cytarabine from 10/07/2009 through 01/12/2010.  His exam, labs and scans continue to demonstrate no evidence of recurrence for lymphoma. He is to follow up in 6 months' time with a CT scan and blood work.      ______________________________ Laurice Record, M.D. LIO/MEDQ  D:  05/22/2011  T:  05/22/2011  Job:  [id number]

## 2011-05-22 NOTE — Telephone Encounter (Signed)
gve the pt his June 2013 appt calendar along with the ct scan appt with instructions.

## 2011-05-22 NOTE — Progress Notes (Signed)
This office note has been dictated.

## 2011-11-01 ENCOUNTER — Telehealth: Payer: Self-pay | Admitting: Hematology and Oncology

## 2011-11-01 ENCOUNTER — Other Ambulatory Visit: Payer: Self-pay | Admitting: *Deleted

## 2011-11-01 NOTE — Telephone Encounter (Signed)
lmonvm for pt re changes in appts. New appts - lb/ct 6/26 and f/u 6/28. New schedule mailed today and changes made per 5/23 pof.

## 2011-11-22 ENCOUNTER — Other Ambulatory Visit (HOSPITAL_COMMUNITY): Payer: PRIVATE HEALTH INSURANCE

## 2011-11-22 ENCOUNTER — Other Ambulatory Visit: Payer: PRIVATE HEALTH INSURANCE

## 2011-11-23 ENCOUNTER — Ambulatory Visit: Payer: PRIVATE HEALTH INSURANCE | Admitting: Hematology and Oncology

## 2011-11-27 ENCOUNTER — Telehealth: Payer: Self-pay | Admitting: Hematology and Oncology

## 2011-11-27 NOTE — Telephone Encounter (Signed)
R/s 6/28 appt to 7/3 due to LO out of office. Per LO work w/pt re what he can do. Pt will keep lb/ct for 6/26 and see LO 7/3 @ 2:30 pm. Pt - aware of change w/new d/t for 7/3 - PM per pt. Also confirmed 6/26 lb/ct.

## 2011-12-05 ENCOUNTER — Other Ambulatory Visit (HOSPITAL_BASED_OUTPATIENT_CLINIC_OR_DEPARTMENT_OTHER): Payer: PRIVATE HEALTH INSURANCE | Admitting: Lab

## 2011-12-05 ENCOUNTER — Ambulatory Visit (HOSPITAL_COMMUNITY)
Admission: RE | Admit: 2011-12-05 | Discharge: 2011-12-05 | Disposition: A | Discharge status: Home / Self Care | Payer: PRIVATE HEALTH INSURANCE | Source: Ambulatory Visit | Attending: Hematology and Oncology | Admitting: Hematology and Oncology

## 2011-12-05 DIAGNOSIS — R911 Solitary pulmonary nodule: Secondary | ICD-10-CM | POA: Insufficient documentation

## 2011-12-05 DIAGNOSIS — N200 Calculus of kidney: Secondary | ICD-10-CM | POA: Insufficient documentation

## 2011-12-05 DIAGNOSIS — C8581 Other specified types of non-Hodgkin lymphoma, lymph nodes of head, face, and neck: Secondary | ICD-10-CM

## 2011-12-05 DIAGNOSIS — C8590 Non-Hodgkin lymphoma, unspecified, unspecified site: Secondary | ICD-10-CM

## 2011-12-05 DIAGNOSIS — C8589 Other specified types of non-Hodgkin lymphoma, extranodal and solid organ sites: Secondary | ICD-10-CM | POA: Insufficient documentation

## 2011-12-05 DIAGNOSIS — K4090 Unilateral inguinal hernia, without obstruction or gangrene, not specified as recurrent: Secondary | ICD-10-CM | POA: Insufficient documentation

## 2011-12-05 LAB — CMP (CANCER CENTER ONLY)
Albumin: 3.9 g/dL (ref 3.3–5.5)
Alkaline Phosphatase: 71 U/L (ref 26–84)
Chloride: 99 mEq/L (ref 98–108)
Glucose, Bld: 109 mg/dL (ref 73–118)
Potassium: 4.3 mEq/L (ref 3.3–4.7)
Sodium: 139 mEq/L (ref 128–145)
Total Protein: 7.4 g/dL (ref 6.4–8.1)

## 2011-12-05 LAB — CBC WITH DIFFERENTIAL/PLATELET
Eosinophils Absolute: 0.2 10*3/uL (ref 0.0–0.5)
MCV: 94 fL (ref 79.3–98.0)
MONO#: 0.4 10*3/uL (ref 0.1–0.9)
MONO%: 7.7 % (ref 0.0–14.0)
NEUT#: 3.5 10*3/uL (ref 1.5–6.5)
RBC: 4.41 10*6/uL (ref 4.20–5.82)
RDW: 13.8 % (ref 11.0–14.6)
WBC: 5.4 10*3/uL (ref 4.0–10.3)
lymph#: 1.3 10*3/uL (ref 0.9–3.3)

## 2011-12-05 MED ORDER — IOHEXOL 300 MG/ML  SOLN
100.0000 mL | Freq: Once | INTRAMUSCULAR | Status: AC | PRN
  Administered 2011-12-05: 100 mL via INTRAVENOUS

## 2011-12-07 ENCOUNTER — Ambulatory Visit: Payer: PRIVATE HEALTH INSURANCE | Admitting: Hematology and Oncology

## 2011-12-12 ENCOUNTER — Telehealth: Payer: Self-pay | Admitting: Hematology and Oncology

## 2011-12-12 ENCOUNTER — Ambulatory Visit (HOSPITAL_BASED_OUTPATIENT_CLINIC_OR_DEPARTMENT_OTHER): Payer: PRIVATE HEALTH INSURANCE | Admitting: Hematology and Oncology

## 2011-12-12 ENCOUNTER — Encounter: Payer: Self-pay | Admitting: Hematology and Oncology

## 2011-12-12 ENCOUNTER — Other Ambulatory Visit: Payer: Self-pay | Admitting: *Deleted

## 2011-12-12 VITALS — BP 133/80 | HR 67 | Temp 97.3°F | Ht 67.0 in | Wt 234.1 lb

## 2011-12-12 DIAGNOSIS — C8581 Other specified types of non-Hodgkin lymphoma, lymph nodes of head, face, and neck: Secondary | ICD-10-CM

## 2011-12-12 DIAGNOSIS — C8590 Non-Hodgkin lymphoma, unspecified, unspecified site: Secondary | ICD-10-CM | POA: Insufficient documentation

## 2011-12-12 NOTE — Progress Notes (Signed)
This office note has been dictated.

## 2011-12-12 NOTE — Patient Instructions (Signed)
Ian Park  [id number]  [facility] Discharge Instructions  RECOMMENDATIONS MADE BY THE CONSULTANT AND ANY TEST RESULTS WILL BE SENT TO YOUR REFERRING DOCTOR.   EXAM FINDINGS BY MD TODAY AND SIGNS AND SYMPTOMS TO REPORT TO CLINIC OR PRIMARY MD:   Your current list of medications are: Current Outpatient Prescriptions  Medication Sig Dispense Refill  . ALPRAZolam (XANAX) 0.25 MG tablet Take 0.25 mg by mouth as needed.        [name] aspirin 81 MG tablet Take 81 mg by mouth daily.        [name] atorvastatin (LIPITOR) 20 MG tablet Take 20 mg by mouth daily.        . cetirizine (ZYRTEC) 10 MG tablet Take 10 mg by mouth daily.        . Multiple Vitamin (MULTIVITAMIN) tablet Take 1 tablet by mouth daily.        . sodium chloride (OCEAN) 0.65 % nasal spray Place 1 spray into the nose as needed. Take  1 - 2  Spays  3 - 4 x/day.          INSTRUCTIONS GIVEN AND DISCUSSED:   SPECIAL INSTRUCTIONS/FOLLOW-UP:  See above.  I acknowledge that I have been informed and understand all the instructions given to me and received a copy. I do not have any more questions at this time, but understand that I may call the [facility] at [phone] during business hours should I have any further questions or need assistance in obtaining follow-up care.

## 2011-12-12 NOTE — Telephone Encounter (Signed)
gv pt appt schedule for December 2013 and January 2014 including ct @ [facility] 06/09/12.

## 2011-12-12 NOTE — Progress Notes (Signed)
CC: Catha Gosselin, M.D.  IDENTIFYING STATEMENT:  The patient is a 60 year old man with a large B- cell lymphoma who presents for followup.  INTERVAL HISTORY:  The patient was seen 6 months ago.  He has had no issues or concerns since last visit.  He denies fever, chills, night sweats, or anorexia.  He has not lost any weight.  He denies adenopathy. He denies pain.  We reviewed results of recent CT scan of the neck, chest, abdomen, and pelvis obtained on 12/05/2011.  There is no evidence for recurrence of lymphoma.  MEDICATIONS:  Reviewed and updated.  ALLERGIES:  None.  PAST MEDICAL HISTORY/FAMILY HISTORY/SOCIAL HISTORY:  Unchanged.  REVIEW OF SYSTEMS:  Ten-point review of systems negative.  PHYSICAL EXAMINATION:  General:  The patient is a well-appearing, well- nourished man in no distress.  Vitals:  Pulse 67, blood pressure 133/80, temperature 97.3, respirations 20, weight 234.1 pounds.  HEENT:  Head is atraumatic, normocephalic.  Sclerae anicteric.  Pupils equal, round, reactive to light.  Mouth moist without ulcerations, thrush, or lesions. Neck:  Supple.  Chest:  Clear to percussion and auscultation.  CVS: Unremarkable.  Abdomen:  Soft, nontender.  Bowel sounds present. Extremities:  No edema.  Lymph Nodes:  No palpable cervical or axillary adenopathy.  CNS:  Nonfocal.  LABORATORY DATA:  12/05/2011 white cell count 5.4, hemoglobin 14.2, hematocrit 31.5, platelets 263.  Sodium 139, potassium 4.3, chloride 99, CO2 29, BUN 12, creatinine 0.9, glucose 109.  T Bili 0.7, alkaline phosphatase 71, AST 30, ALT 35.  Results of CT as in interval history.  IMPRESSION AND PLAN:  Mr. Shaker is a 61 year old man with history of nasopharyngeal large B-cell lymphoma diagnosed in 2011.  He is status post 6 cycles of R-CHOP alternating with intrathecal methotrexate and cytarabine from 09/17/2009 through 01/12/2010.  He exam and labs scans show no evidence of recurrence.  I wonder if he  requires ENT exam.  I will see if he can be seen by Dr. Beverlee Nims.  He follows up in 6 months' time.    ______________________________ Laurice Record, M.D. LIO/MEDQ  D:  12/12/2011  T:  12/12/2011  Job:  [id number]

## 2012-02-27 IMAGING — RF DG FLUORO GUIDE SPINAL/SI JT INJ*L*
1 series · 1 of 1 positions shown · non-contrast
Comparison: None.

CLINICAL DATA: Large cell lymphoma.  Intrathecal methotrexate
injection.

SPINAL INJECTION UNDER FLUOROSCOPY (LEFT)

[Series 1: run · 1 of 1 slices shown]
[im 1/1]
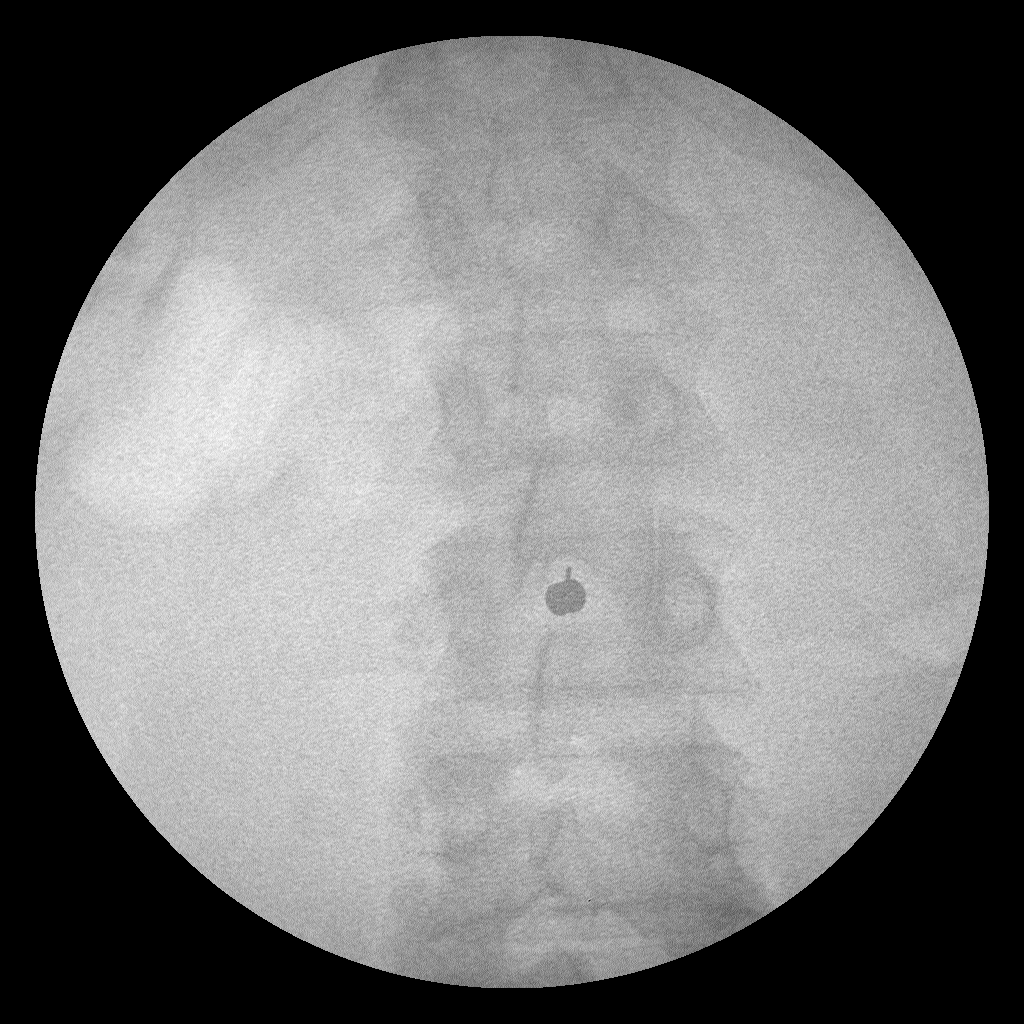

[1 of 1 positions shown; findings below may reference images not displayed]

FINDINGS: Written informed consent was obtained from the patient
for the procedure.  A time-out was performed prior the procedure.
The patient was placed prone on the fluoroscopy table and the back
was prepped and draped in sterile fashion.  Using intermittent
fluoroscopy, a 20 gauge spinal needle was advanced into the thecal
sac at the L2-3 level.  There was return of clear CSF.  Opening
pressure was 15 cm of water.

The pre mixed methotrexate and steroid was then injected into the
intrathecal space slowly (over 2-3 minutes).  Following injection,
the needle was removed.  No immediate complications.  The patient
tolerated procedure well.
IMPRESSION: Technically successful intrathecal methotrexate injection as above.

## 2012-03-10 ENCOUNTER — Other Ambulatory Visit: Payer: Self-pay | Admitting: Dermatology

## 2012-04-01 IMAGING — CT CT ABD-PELV W/ CM
2 of 7 series · 14 of 46 positions shown, 18 images · IV contrast (agent unspecified)
Comparison: PET CT done today.  PET CT and diagnostic CTs
09/19/2009.

CT NECK

CLINICAL DATA: Restaging B-cell lymphoma - chemotherapy in
progress.

CT NECK, CHEST, ABDOMEN AND PELVIS WITH CONTRAST
TECHNIQUE: Multidetector CT imaging of the neck, chest, abdomen
and pelvis was performed using the standard protocol following the
bolus administration of intravenous contrast.
Contrast: 100 ml 5mnipaque-4WW intravenously.

[Series 2: cap st · axial · 0.81mm/px · z∈[-759,-199]mm · 11 of 129 slices shown, 15 images]
[im 9/129  soft-tissue]
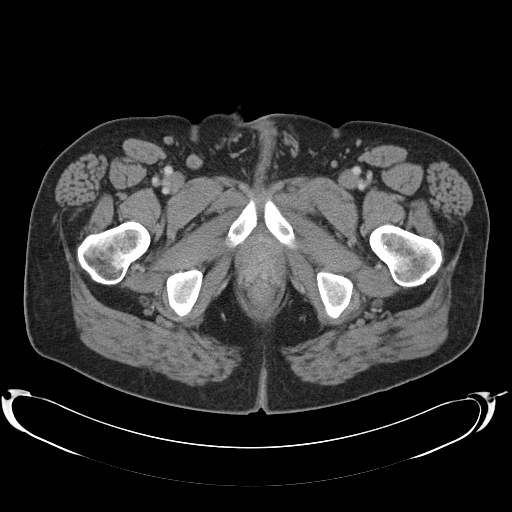
[im 9/129  bone]
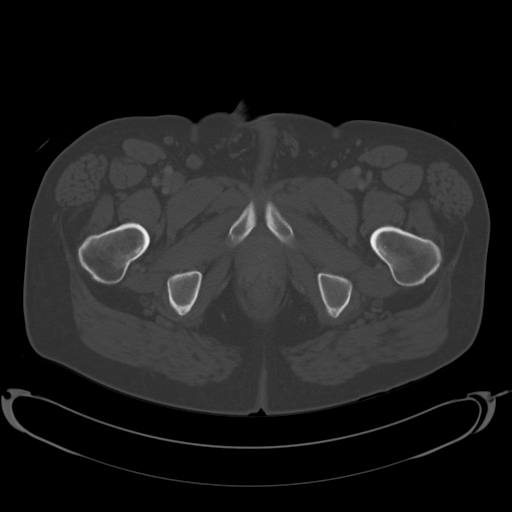
[im 25/129  soft-tissue]
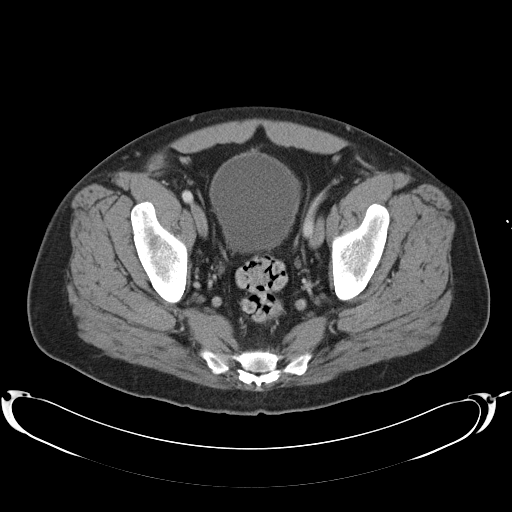
[im 41/129  soft-tissue]
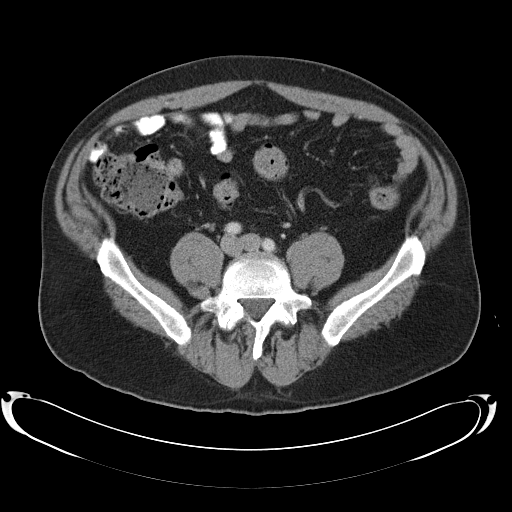
[im 49/129  soft-tissue]
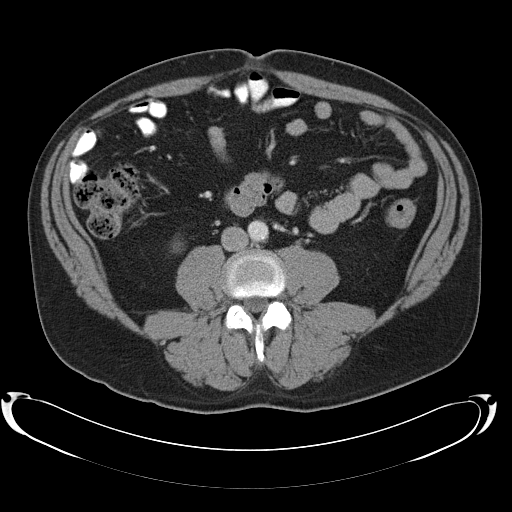
[im 65/129  soft-tissue]
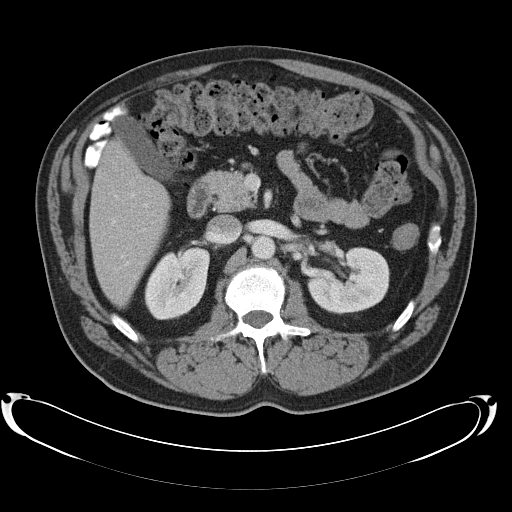
[im 81/129  soft-tissue]
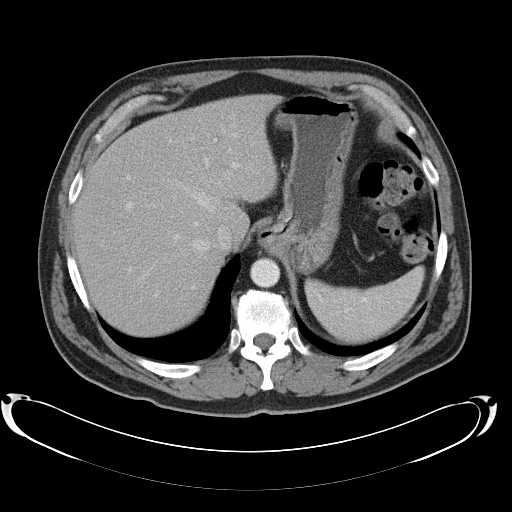
[im 89/129  soft-tissue]
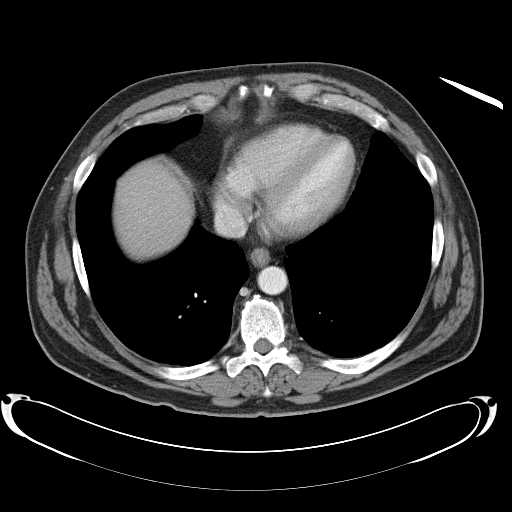
[im 97/129  lung]
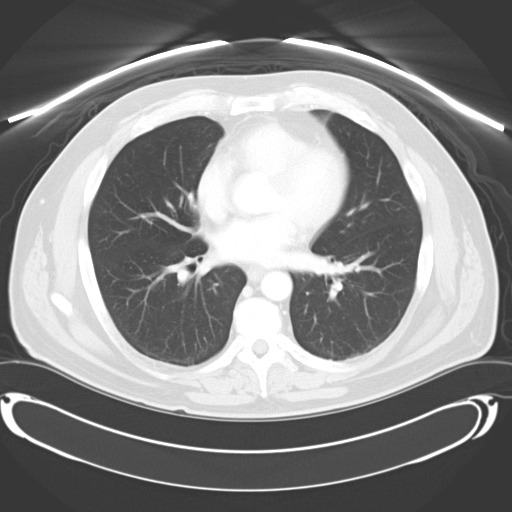
[im 105/129  soft-tissue]
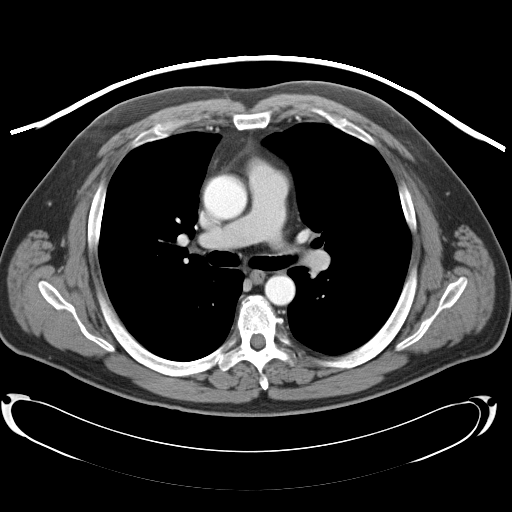
[im 105/129  lung]
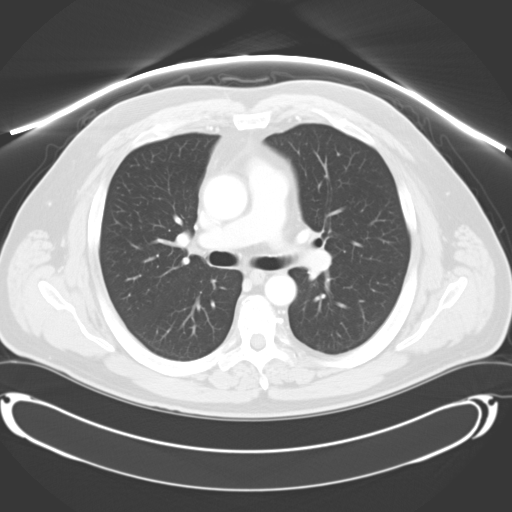
[im 113/129  lung]
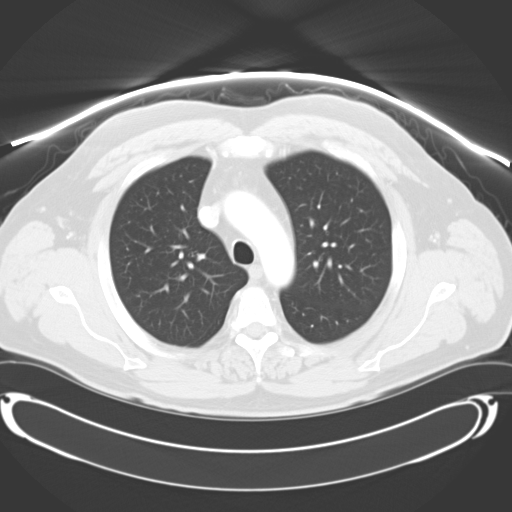
[im 121/129  soft-tissue]
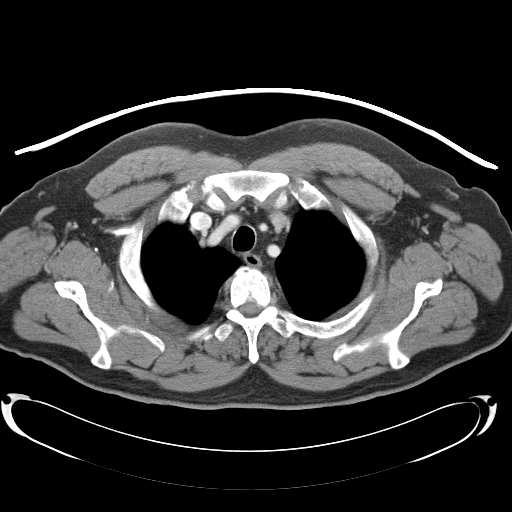
[im 121/129  lung]
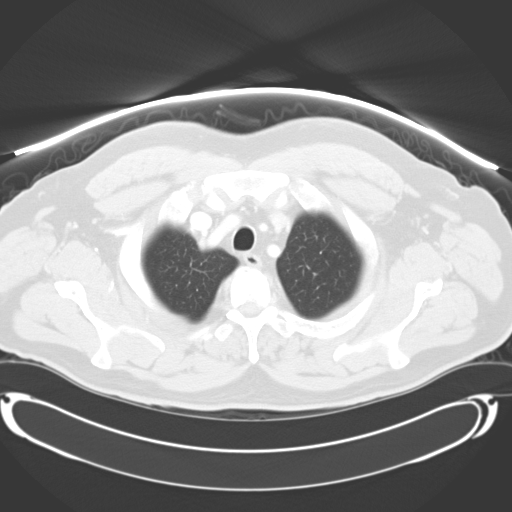
[im 121/129  bone]
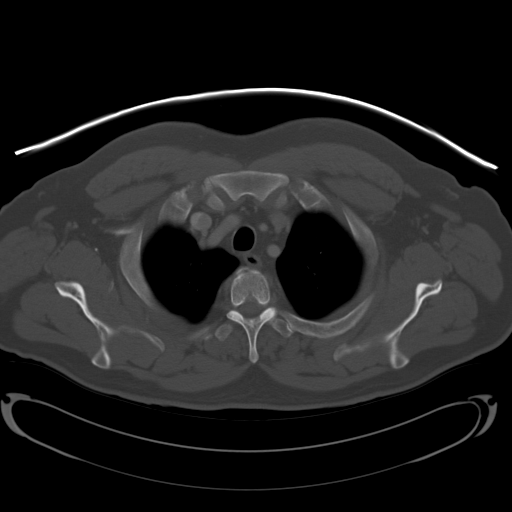

[Series 602: <mpr thick range> · coronal · 1.26mm/px · 3 of 104 slices shown]
[im 21/104  soft-tissue]
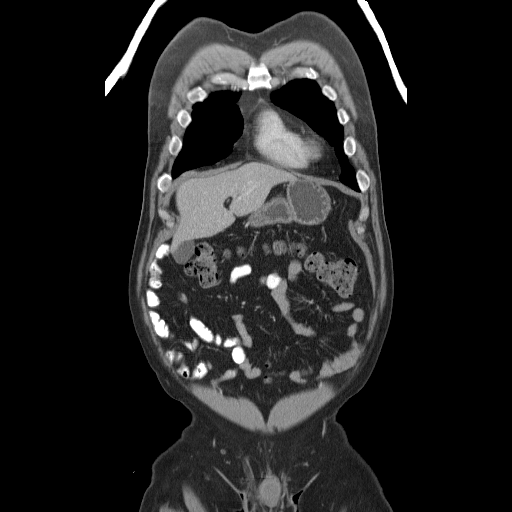
[im 42/104  soft-tissue]
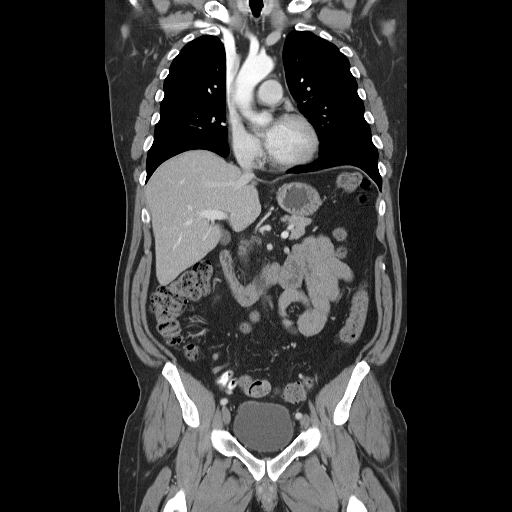
[im 62/104  soft-tissue]
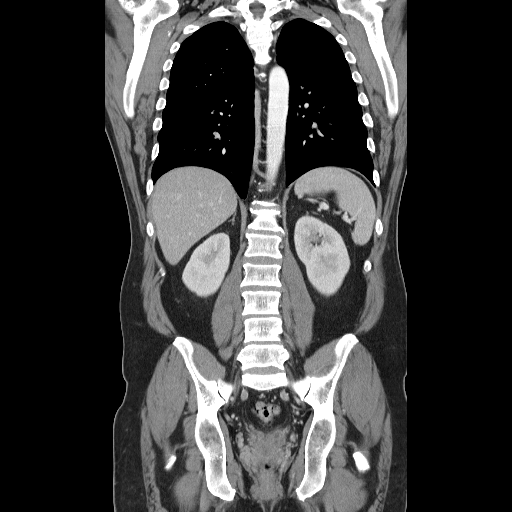

[14 of 46 positions shown; findings below may reference images not displayed]

FINDINGS: The previously demonstrated extensive cervical
lymphadenopathy has resolved.  There are no residual enlarged lymph
nodes.  There is mild asymmetric soft tissue stranding surrounding
the left carotid sheath.  There is no evidence of large vessel
thrombosis.

The thyroid and salivary glands appear unremarkable.  There are
small mucous retention cysts within the maxillary sinuses.  There
are no suspicious osseous findings.  The visualized intracranial
contents are unremarkable.
IMPRESSION: Resolution of cervical lymphadenopathy.

CT CHEST
FINDINGS: Previously demonstrated left supraclavicular
lymphadenopathy has resolved.  There are no enlarged mediastinal,
hilar or axillary lymph nodes.  There is no pleural or pericardial
effusion.  A small hiatal hernia appears unchanged.

3 mm subpleural right lower lobe nodule on image 24 is unchanged.
There is a tiny subpleural nodule in the left upper lobe on image
11 which also is stable.  No new or enlarging pulmonary nodules are
demonstrated.  There are no suspicious osseous findings.
IMPRESSION: 1.  Resolution of the left supraclavicular lymphadenopathy.
2.  Stable tiny pulmonary nodules, likely benign.

CT ABDOMEN AND PELVIS
FINDINGS: The spleen is normal in size without focal abnormality.
The liver, gallbladder, pancreas, adrenal glands and kidneys appear
normal.

There are no enlarged abdominal pelvic lymph nodes.  Moderate stool
is present throughout the colon.  There is no bowel wall
thickening.  The appendix appears normal.

The previously demonstrated peripheral low density within the
prostate gland has improved.  There is no surrounding inflammatory
change.  The urinary bladder appears unremarkable.  There are no
suspicious osseous findings.
IMPRESSION: 1.  No evidence of lymphoma in the abdomen or pelvis.
2.  Interval improvement in low density peripherally in the
prostate gland suggesting resolved/improved inflammation -
correlate clinically.

## 2012-05-31 ENCOUNTER — Telehealth: Payer: Self-pay | Admitting: Oncology

## 2012-05-31 NOTE — Telephone Encounter (Signed)
S/w the pt and he is aware of the upcoming change in his md  From dr odogwu to dr Arline Asp. Pt is aware that i will call him back with a sooner appt since the pt has a ct scan appt in dec and needs an appt to follow.

## 2012-05-31 NOTE — Telephone Encounter (Signed)
S/w the pt to confirm the change from dr odogwu to dr Arline Asp with the updated d/t of the new appt. Pt is aware.

## 2012-06-09 ENCOUNTER — Other Ambulatory Visit: Payer: Self-pay | Admitting: Hematology and Oncology

## 2012-06-09 ENCOUNTER — Ambulatory Visit (HOSPITAL_COMMUNITY): Payer: PRIVATE HEALTH INSURANCE

## 2012-06-09 ENCOUNTER — Other Ambulatory Visit (HOSPITAL_BASED_OUTPATIENT_CLINIC_OR_DEPARTMENT_OTHER): Payer: PRIVATE HEALTH INSURANCE | Admitting: Lab

## 2012-06-09 ENCOUNTER — Ambulatory Visit (HOSPITAL_COMMUNITY)
Admission: RE | Admit: 2012-06-09 | Discharge: 2012-06-09 | Disposition: A | Discharge status: Home / Self Care | Payer: PRIVATE HEALTH INSURANCE | Source: Ambulatory Visit | Attending: Hematology and Oncology | Admitting: Hematology and Oncology

## 2012-06-09 DIAGNOSIS — C8589 Other specified types of non-Hodgkin lymphoma, extranodal and solid organ sites: Secondary | ICD-10-CM | POA: Insufficient documentation

## 2012-06-09 DIAGNOSIS — Z9221 Personal history of antineoplastic chemotherapy: Secondary | ICD-10-CM | POA: Insufficient documentation

## 2012-06-09 DIAGNOSIS — C8590 Non-Hodgkin lymphoma, unspecified, unspecified site: Secondary | ICD-10-CM

## 2012-06-09 LAB — COMPREHENSIVE METABOLIC PANEL (CC13)
ALT: 23 U/L (ref 0–55)
AST: 21 U/L (ref 5–34)
Albumin: 3.7 g/dL (ref 3.5–5.0)
Alkaline Phosphatase: 74 U/L (ref 40–150)
Potassium: 4.2 mEq/L (ref 3.5–5.1)
Sodium: 141 mEq/L (ref 136–145)
Total Protein: 7 g/dL (ref 6.4–8.3)

## 2012-06-09 LAB — CBC WITH DIFFERENTIAL/PLATELET
EOS%: 2.8 % (ref 0.0–7.0)
Eosinophils Absolute: 0.2 10*3/uL (ref 0.0–0.5)
MCH: 32 pg (ref 27.2–33.4)
MCV: 93.1 fL (ref 79.3–98.0)
MONO%: 7.2 % (ref 0.0–14.0)
NEUT#: 3.6 10*3/uL (ref 1.5–6.5)
RBC: 4.64 10*6/uL (ref 4.20–5.82)
RDW: 13.5 % (ref 11.0–14.6)

## 2012-06-09 MED ORDER — IOHEXOL 300 MG/ML  SOLN
125.0000 mL | Freq: Once | INTRAMUSCULAR | Status: AC | PRN
  Administered 2012-06-09: 125 mL via INTRAVENOUS

## 2012-06-13 ENCOUNTER — Ambulatory Visit (HOSPITAL_BASED_OUTPATIENT_CLINIC_OR_DEPARTMENT_OTHER): Payer: PRIVATE HEALTH INSURANCE | Admitting: Family

## 2012-06-13 ENCOUNTER — Encounter: Payer: Self-pay | Admitting: Family

## 2012-06-13 ENCOUNTER — Ambulatory Visit: Payer: PRIVATE HEALTH INSURANCE | Admitting: Hematology and Oncology

## 2012-06-13 ENCOUNTER — Telehealth: Payer: Self-pay | Admitting: Oncology

## 2012-06-13 VITALS — BP 126/78 | HR 80 | Temp 98.0°F | Resp 20 | Ht 67.0 in | Wt 238.6 lb

## 2012-06-13 DIAGNOSIS — C8589 Other specified types of non-Hodgkin lymphoma, extranodal and solid organ sites: Secondary | ICD-10-CM

## 2012-06-13 DIAGNOSIS — C8590 Non-Hodgkin lymphoma, unspecified, unspecified site: Secondary | ICD-10-CM

## 2012-06-13 NOTE — Telephone Encounter (Signed)
appts made and printed for pt Contrast given and pt aware that he will get a call/letter with scan appts       anne

## 2012-06-13 NOTE — Patient Instructions (Addendum)
Please contact us at [phone] if you have any questions or concerns.  Please schedule a check up with Dr. Pollyann Kennedy, ENT

## 2012-06-13 NOTE — Progress Notes (Signed)
Patient ID: Ian Park, male   DOB: [birth year], 62 y.o.   MRN: [id number] CSN: [id number]  CC: Catha Gosselin, MD Jethro Bolus, MD Serena Colonel, MD    Problem List: Ian Park is a 62 y.o. Caucasian male with a problem list consisting of:  1.  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    CT of abdomen/pelvis with contrast, Soft tissue neck CT with contrast and CT of chest on 06/09/2012 did not show any evidence of recurrence.   2.  Hypercholesterolemia 3.  BPH 4.  Gynecomastia   Ian Park is a 62 year old Caucasian male with a history of nasopharyngeal large B-cell lymphoma diagnosed in 2011. He is status post 6 cycles of R-CHOP alternating with intrathecal methotrexate and cytarabine from 09/17/2009 through 01/12/2010.  Since his last office visit on 12/12/2011 with Dr. Dalene Carrow, he states that he has been doing well.  Ian Park works at the [employer] in [place] and states that he has a great energy level and good appetite.   He reports occasional nighttime nausea that lasts for 2-3 nights in a row (without emesis) and is relieved when he takes Pepto Bismol.  He also reports a sinus infection that happened approximately 10 days ago that has since resolved.  He took Benadryl and Zyrtec and his sinus symptoms subsided.  He also reports a small nodule on the lateral aspect of his right leg that he states appeared 2 weeks ago.  The nodule is soft, not warm, not erythemic and is surrounded by varicose veins.  The patient denies any other symptomatology.     His recent CT scans of the abdomen/pelvis, soft tissue neck and chest were discussed with the patient and he received copies of the scans.  Dr. Dalene Carrow discussed in her last note that Ian Park should have a ENT examination with Dr. Pollyann Kennedy.  The patient and I discussed a follow-up exam with Dr. Pollyann Kennedy as well.   Ian Park enlarged prostate is  followed by Dr. Patsi Sears, Urologist.  The patient and I spoke about his gynecomastia and self-breast exams with a demonstration was completed during this office visit.    Past Medical History: Past Medical History  Diagnosis Date  . Large B-cell lymphoma 09/10/2009    Nasopharyngeal  . Cancer     lymphoma  . Hypercholesterolemia     Surgical History: Past Surgical History  Procedure Date  . Knee arthroscopy 1982    Right knee  . Shoulder arthroscopy 1986    Right shoulder  . Shoulder arthroscopy 1993    Left shoulder  . Rotator cuff repair 1997    Right shoulder    Current Medications: Current Outpatient Prescriptions  Medication Sig Dispense Refill  . ALPRAZolam (XANAX) 0.25 MG tablet Take 0.25 mg by mouth as needed.        [name] aspirin 81 MG tablet Take 81 mg by mouth daily.        [name] atorvastatin (LIPITOR) 20 MG tablet Take 20 mg by mouth daily.        . cetirizine (ZYRTEC) 10 MG tablet Take 10 mg by mouth daily.        . Multiple Vitamin (MULTIVITAMIN) tablet Take 1 tablet by mouth daily.        . sodium chloride (OCEAN) 0.65 % nasal spray Place 1 spray into the nose as needed. Take  1 - 2  Spays  3 -  4 x/day.         Allergies: No Known Allergies  Family History: Family History  Problem Relation Age of Onset  . Cataracts Mother   . Heart attack Father   . Cholelithiasis Brother     Social History: History  Substance Use Topics  . Smoking status: Former Smoker -- 1.0 packs/day for 10 years    Types: Cigars, Pipe  . Smokeless tobacco: Never Used  . Alcohol Use: 3.0 oz/week    5 Shots of liquor per week    Review of Systems: 10 Point review of systems was completed and is negative except as noted above.   Physical Exam:   Blood pressure 126/78, pulse 80, temperature 98 F (36.7 C), temperature source Oral, resp. rate 20, height 5\' 7"  (1.702 m), weight 238 lb 9.6 oz (108.228 kg).  General appearance: Alert, cooperative, well nourished, no apparent  distress Head: Normocephalic, without obvious abnormality, atraumatic Eyes: Conjunctivae/corneas clear, PERRLA, EOMI, amblyopia of the left eye Nose: Nares, septum and mucosa are normal, no drainage or sinus tenderness Neck: Cervical adenopathy, supple, symmetrical, trachea midline, thyroid not enlarged, no tenderness Resp: Clear to auscultation bilaterally, diminished bibasilar breath sounds Cardio: Regular rate and rhythm, S1, S2 normal, no murmur, click, rub or gallop Breasts:  Normal breast exam, soft, no nodules, non-tender, no discharge, right breast is larger than the left breast GI: Soft, distended, non-tender, hypoactive bowel sounds,  no organomegaly Extremities: Extremities normal, atraumatic, no cyanosis or edema, port-wine stain on right lower extremity, bilateral lower extremity varicose veins RLE > LLE Lymph nodes: Cervical adenopathy, supraclavicular and axillary nodes normal Neurologic: Grossly normal   Laboratory Data: Recent Results (from the past 168 hour(s))  CBC WITH DIFFERENTIAL   Collection Time   06/09/12  8:46 AM      Component Value Range   WBC 5.5  4.0 - 10.3 10e3/uL   NEUT# 3.6  1.5 - 6.5 10e3/uL   HGB 14.8  13.0 - 17.1 g/dL   HCT 16.1  09.6 - 04.5 %   Platelets 254  140 - 400 10e3/uL   MCV 93.1  79.3 - 98.0 fL   MCH 32.0  27.2 - 33.4 pg   MCHC 34.3  32.0 - 36.0 g/dL   RBC 4.09  8.11 - 9.14 10e6/uL   RDW 13.5  11.0 - 14.6 %   lymph# 1.3  0.9 - 3.3 10e3/uL   MONO# 0.4  0.1 - 0.9 10e3/uL   Eosinophils Absolute 0.2  0.0 - 0.5 10e3/uL   Basophils Absolute 0.0  0.0 - 0.1 10e3/uL   NEUT% 65.8  39.0 - 75.0 %   LYMPH% 23.7  14.0 - 49.0 %   MONO% 7.2  0.0 - 14.0 %   EOS% 2.8  0.0 - 7.0 %   BASO% 0.5  0.0 - 2.0 %  COMPREHENSIVE METABOLIC PANEL (CC13)   Collection Time   06/09/12  8:47 AM      Component Value Range   Sodium 141  136 - 145 mEq/L   Potassium 4.2  3.5 - 5.1 mEq/L   Chloride 107  98 - 107 mEq/L   CO2 23  22 - 29 mEq/L   Glucose 114 (*) 70  - 99 mg/dl   BUN 78.2  7.0 - 95.6 mg/dL   Creatinine 1.0  0.7 - 1.3 mg/dL   Total Bilirubin 2.13  0.20 - 1.20 mg/dL   Alkaline Phosphatase 74  40 - 150 U/L   AST 21  5 -  34 U/L   ALT 23  0 - 55 U/L   Total Protein 7.0  6.4 - 8.3 g/dL   Albumin 3.7  3.5 - 5.0 g/dL   Calcium 9.2  8.4 - 62.1 mg/dL  LACTATE DEHYDROGENASE (CC13)   Collection Time   06/09/12  8:47 AM      Component Value Range   LDH 170  125 - 245 U/L    Imaging Studies: 1.CT of the soft tissue neck w/contrast on 06/09/2012 showed asymmetric fullness of the posterior-superior nasopharynx greater on the left similar to the prior exams suggestive of result of treated tumor.  This area do not demonstrate abnormal F D G uptake on the most recent PET CT available.  Scattered normal size neck lymph nodes stable.  Visualized intracranial structures and orbital structures unremarkable.  Polypoid opacification inferior aspect maxillary sinuses more notable on the right with polypoid opacification superior aspect left maxillary sinus.  Cervical spondylotic changes with various degrees of spinal stenosis and foraminal narrowing.  No bony destructive lesion.  Stable post therapy CT of the neck without findings of recurrence.  2. CT of the chest w/contrast on 06/09/2012 showed no axillary adenopathy.  There is mild asymmetric gynecomastia on the right is which is not changed compared to prior.  No supraclavicular lymphadenopathy.  No mediastinal or hilar lymphadenopathy.  No pericardial fluid.  Esophagus is normal.  Tiny 2 mm pulmonary nodule right middle lobe (image 34) is unchanged.   Pleural based nodule in the posterior superior right lower lobe measures 4 mm (image 24) which is unchanged. There are no new or suspicious pulmonary nodules.  No evidence of lymphoma recurrence within the thorax.   3.  CT of the abdomen/pelvis w/contrast on  06/09/2012 showed no focal hepatic lesion.  The spleen is normal volume. The pancreas, adrenal glands, and  kidneys are normal.  The stomach, small bowel, appendix, and colon are normal.  Abdominal aorta normal caliber.  No retroperitoneal or periportal lymphadenopathy.  No mesenteric adenopathy.  No peritoneal disease.  In the pelvis, the prostate gland and bladder are within normal limits.  The prostate gland is enlarged to 70 mm.  No pelvic lymphadenopathy.  No inguinal adenopathy.  Review of  bone windows demonstrates no aggressive osseous lesions.  No evidence of lymphoma recurrence within the abdomen or pelvis. Normal volume spleen.  Prostate hypertrophy       Impression/Plan: Ian Park is a 62 year old Caucasian male with history of nasopharyngeal large B-cell lymphoma diagnosed in 2011. His exam, labs and recent CT scans show no evidence of recurrence. He has agreed to contact Dr. Pollyann Kennedy for a follow up ENT examination.  We plan to see Ian Park again in 6 months with CT scans and laboratories to be obtained prior to his visit with Dr. Arline Asp.  He is scheduled for CT scans of the abdomen/pelvis, neck soft tissue and chest with contrast and CBC, CMP and LDH on 12/01/2012 with an office visit with Dr. Arline Asp on 12/02/2012.  Ian Park is encouraged to contact us in the interim if he has any questions or concerns.    Larina Bras, NP-C 06/13/2012, 10:38 AM

## 2012-09-24 ENCOUNTER — Emergency Department (HOSPITAL_BASED_OUTPATIENT_CLINIC_OR_DEPARTMENT_OTHER)
Admission: EM | Admit: 2012-09-24 | Discharge: 2012-09-24 | Disposition: A | Discharge status: Home / Self Care | Payer: PRIVATE HEALTH INSURANCE | Attending: Emergency Medicine | Admitting: Emergency Medicine

## 2012-09-24 ENCOUNTER — Encounter (HOSPITAL_BASED_OUTPATIENT_CLINIC_OR_DEPARTMENT_OTHER): Payer: Self-pay | Admitting: *Deleted

## 2012-09-24 DIAGNOSIS — Z87891 Personal history of nicotine dependence: Secondary | ICD-10-CM | POA: Insufficient documentation

## 2012-09-24 DIAGNOSIS — E7800 Pure hypercholesterolemia, unspecified: Secondary | ICD-10-CM | POA: Insufficient documentation

## 2012-09-24 DIAGNOSIS — Z87898 Personal history of other specified conditions: Secondary | ICD-10-CM | POA: Insufficient documentation

## 2012-09-24 DIAGNOSIS — Z7982 Long term (current) use of aspirin: Secondary | ICD-10-CM | POA: Insufficient documentation

## 2012-09-24 DIAGNOSIS — Z79899 Other long term (current) drug therapy: Secondary | ICD-10-CM | POA: Insufficient documentation

## 2012-09-24 DIAGNOSIS — Z87442 Personal history of urinary calculi: Secondary | ICD-10-CM | POA: Insufficient documentation

## 2012-09-24 DIAGNOSIS — N23 Unspecified renal colic: Secondary | ICD-10-CM | POA: Insufficient documentation

## 2012-09-24 LAB — URINALYSIS, ROUTINE W REFLEX MICROSCOPIC
Glucose, UA: NEGATIVE mg/dL
Ketones, ur: NEGATIVE mg/dL
Protein, ur: NEGATIVE mg/dL

## 2012-09-24 LAB — BASIC METABOLIC PANEL
BUN: 17 mg/dL (ref 6–23)
Chloride: 101 mEq/L (ref 96–112)
GFR calc Af Amer: 57 mL/min — ABNORMAL LOW (ref >90)
Potassium: 4.3 mEq/L (ref 3.5–5.1)
Sodium: 138 mEq/L (ref 135–145)

## 2012-09-24 LAB — URINE MICROSCOPIC-ADD ON

## 2012-09-24 MED ORDER — KETOROLAC TROMETHAMINE 30 MG/ML IJ SOLN
30.0000 mg | Freq: Once | INTRAMUSCULAR | Status: AC
  Administered 2012-09-24: 30 mg via INTRAVENOUS
  Filled 2012-09-24: qty 1

## 2012-09-24 MED ORDER — HYDROCODONE-ACETAMINOPHEN 5-325 MG PO TABS: 2.0000 | ORAL_TABLET | ORAL | Status: DC | PRN

## 2012-09-24 NOTE — ED Provider Notes (Signed)
History     CSN: [id number]  Arrival date & time 09/24/12  2156   First MD Initiated Contact with Patient 09/24/12 2243      Chief Complaint  Patient presents with  . Flank Pain    (Consider location/radiation/quality/duration/timing/severity/associated sxs/prior treatment) HPI Presents with right flank pain onset 10 AM today typical of kidney stones he's had in the past. Pain is nonradiating not made better or worse by anything. He treated himself with a Vicodin tablet tonight without adequate pain relief. No other associated symptoms no nausea or vomiting no fever. Pain is moderate to severe at present. Past Medical History  Diagnosis Date  . Large B-cell lymphoma 09/10/2009    Nasopharyngeal  . Cancer     lymphoma  . Hypercholesterolemia   . Kidney stone     Past Surgical History  Procedure Laterality Date  . Knee arthroscopy  1982    Right knee  . Shoulder arthroscopy  1986    Right shoulder  . Shoulder arthroscopy  1993    Left shoulder  . Rotator cuff repair  1997    Right shoulder    Family History  Problem Relation Age of Onset  . Cataracts Mother   . Heart attack Father   . Cholelithiasis Brother     History  Substance Use Topics  . Smoking status: Former Smoker -- 1.00 packs/day for 10 years    Types: Cigars, Pipe  . Smokeless tobacco: Never Used  . Alcohol Use: 3.0 oz/week    5 Shots of liquor per week      Review of Systems  Constitutional: Negative.   HENT: Negative.   Respiratory: Negative.   Gastrointestinal: Negative.   Genitourinary: Positive for flank pain.  Musculoskeletal: Negative.   Skin: Negative.   Neurological: Negative.  Negative for weakness.  Hematological: Negative.   Psychiatric/Behavioral: Negative.   All other systems reviewed and are negative.    Allergies  Review of patient's allergies indicates no known allergies.  Home Medications   Current Outpatient Rx  Name  Route  Sig  Dispense  Refill  . ALPRAZolam  (XANAX) 0.25 MG tablet   Oral   Take 0.25 mg by mouth as needed.           [name] aspirin 81 MG tablet   Oral   Take 81 mg by mouth daily.           [name] atorvastatin (LIPITOR) 20 MG tablet   Oral   Take 20 mg by mouth daily.           . cetirizine (ZYRTEC) 10 MG tablet   Oral   Take 10 mg by mouth daily.           . Multiple Vitamin (MULTIVITAMIN) tablet   Oral   Take 1 tablet by mouth daily.           . sodium chloride (OCEAN) 0.65 % nasal spray   Nasal   Place 1 spray into the nose as needed. Take  1 - 2  Spays  3 - 4 x/day.            BP 133/88  Pulse 92  Temp(Src) 98.3 F (36.8 C)  Resp 16  SpO2 100%  Physical Exam  Nursing note and vitals reviewed. Constitutional: He appears well-developed and well-nourished. He appears distressed.  Appears mildly uncomfortable  HENT:  Head: Normocephalic and atraumatic.  Eyes: Conjunctivae are normal. Pupils are equal, round, and reactive to light.  Neck:  Neck supple. No tracheal deviation present. No thyromegaly present.  Cardiovascular: Normal rate and regular rhythm.   No murmur heard. Pulmonary/Chest: Effort normal and breath sounds normal.  Abdominal: Soft. Bowel sounds are normal. He exhibits no distension. There is no tenderness.  Obese  Genitourinary: Penis normal.  No flank tenderness  Musculoskeletal: Normal range of motion. He exhibits no edema and no tenderness.  Neurological: He is alert. Coordination normal.  Skin: Skin is warm and dry. No rash noted.  Psychiatric: He has a normal mood and affect.    ED Course  Procedures (including critical care time)  Labs Reviewed  URINALYSIS, ROUTINE W REFLEX MICROSCOPIC - Abnormal; Notable for the following:    APPearance CLOUDY (*)    Hgb urine dipstick LARGE (*)    Leukocytes, UA TRACE (*)    All other components within normal limits  URINE MICROSCOPIC-ADD ON - Abnormal; Notable for the following:    Casts HYALINE CASTS (*)    All other components within  normal limits   No results found.   No diagnosis found.  Results for orders placed during the hospital encounter of 09/24/12  URINALYSIS, ROUTINE W REFLEX MICROSCOPIC      Result Value Range   Color, Urine YELLOW  YELLOW   APPearance CLOUDY (*) CLEAR   Specific Gravity, Urine 1.025  1.005 - 1.030   pH 6.5  5.0 - 8.0   Glucose, UA NEGATIVE  NEGATIVE mg/dL   Hgb urine dipstick LARGE (*) NEGATIVE   Bilirubin Urine NEGATIVE  NEGATIVE   Ketones, ur NEGATIVE  NEGATIVE mg/dL   Protein, ur NEGATIVE  NEGATIVE mg/dL   Urobilinogen, UA 1.0  0.0 - 1.0 mg/dL   Nitrite NEGATIVE  NEGATIVE   Leukocytes, UA TRACE (*) NEGATIVE  URINE MICROSCOPIC-ADD ON      Result Value Range   Squamous Epithelial / LPF RARE  RARE   WBC, UA 0-2  <3 WBC/hpf   RBC / HPF 11-20  <3 RBC/hpf   Bacteria, UA RARE  RARE   Casts HYALINE CASTS (*) NEGATIVE  BASIC METABOLIC PANEL      Result Value Range   Sodium 138  135 - 145 mEq/L   Potassium 4.3  3.5 - 5.1 mEq/L   Chloride 101  96 - 112 mEq/L   CO2 25  19 - 32 mEq/L   Glucose, Bld 155 (*) 70 - 99 mg/dL   BUN 17  6 - 23 mg/dL   Creatinine, Ser 6.64 (*) 0.50 - 1.35 mg/dL   Calcium 9.6  8.4 - 40.3 mg/dL   GFR calc non Af Amer 49 (*) >90 mL/min   GFR calc Af Amer 57 (*) >90 mL/min   No results found.  11:30 PM patient feels much improved after treatment with Toradol IV. MDM  In light of mild renal insufficiency patient is advised to avoid further doses of NSAIDS. Encouraged to drink lots of fluids. Plan prescription Norco, Urine strainer. Followup with Dr/ Patsi Sears within 1 week Diagnosis #1 ureteral colic #2 renal insufficiency #3 hyperglycemia       Doug Sou, MD 09/24/12 2345

## 2012-09-24 NOTE — ED Notes (Signed)
Pt c/o right flank pain x 1 day, HX kidney stones

## 2012-09-24 NOTE — ED Notes (Signed)
MD at bedside. 

## 2012-09-24 NOTE — ED Notes (Signed)
MD at bedside giving test results and plan of care. 

## 2012-12-01 ENCOUNTER — Other Ambulatory Visit (HOSPITAL_BASED_OUTPATIENT_CLINIC_OR_DEPARTMENT_OTHER): Payer: PRIVATE HEALTH INSURANCE

## 2012-12-01 ENCOUNTER — Encounter (HOSPITAL_COMMUNITY): Payer: Self-pay

## 2012-12-01 ENCOUNTER — Ambulatory Visit (HOSPITAL_COMMUNITY)
Admission: RE | Admit: 2012-12-01 | Discharge: 2012-12-01 | Disposition: A | Discharge status: Home / Self Care | Payer: PRIVATE HEALTH INSURANCE | Source: Ambulatory Visit | Attending: Family | Admitting: Family

## 2012-12-01 DIAGNOSIS — N62 Hypertrophy of breast: Secondary | ICD-10-CM | POA: Insufficient documentation

## 2012-12-01 DIAGNOSIS — M47814 Spondylosis without myelopathy or radiculopathy, thoracic region: Secondary | ICD-10-CM | POA: Insufficient documentation

## 2012-12-01 DIAGNOSIS — C8589 Other specified types of non-Hodgkin lymphoma, extranodal and solid organ sites: Secondary | ICD-10-CM | POA: Insufficient documentation

## 2012-12-01 DIAGNOSIS — R911 Solitary pulmonary nodule: Secondary | ICD-10-CM | POA: Insufficient documentation

## 2012-12-01 DIAGNOSIS — N210 Calculus in bladder: Secondary | ICD-10-CM | POA: Insufficient documentation

## 2012-12-01 DIAGNOSIS — N400 Enlarged prostate without lower urinary tract symptoms: Secondary | ICD-10-CM | POA: Insufficient documentation

## 2012-12-01 DIAGNOSIS — I700 Atherosclerosis of aorta: Secondary | ICD-10-CM | POA: Insufficient documentation

## 2012-12-01 DIAGNOSIS — C8590 Non-Hodgkin lymphoma, unspecified, unspecified site: Secondary | ICD-10-CM

## 2012-12-01 DIAGNOSIS — M51379 Other intervertebral disc degeneration, lumbosacral region without mention of lumbar back pain or lower extremity pain: Secondary | ICD-10-CM | POA: Insufficient documentation

## 2012-12-01 DIAGNOSIS — M5137 Other intervertebral disc degeneration, lumbosacral region: Secondary | ICD-10-CM | POA: Insufficient documentation

## 2012-12-01 LAB — COMPREHENSIVE METABOLIC PANEL (CC13)
Albumin: 3.6 g/dL (ref 3.5–5.0)
BUN: 11.6 mg/dL (ref 7.0–26.0)
CO2: 22 mEq/L (ref 22–29)
Calcium: 9.1 mg/dL (ref 8.4–10.4)
Chloride: 105 mEq/L (ref 98–107)
Glucose: 113 mg/dl — ABNORMAL HIGH (ref 70–99)
Potassium: 4.1 mEq/L (ref 3.5–5.1)
Sodium: 140 mEq/L (ref 136–145)
Total Protein: 6.9 g/dL (ref 6.4–8.3)

## 2012-12-01 LAB — CBC WITH DIFFERENTIAL/PLATELET
Basophils Absolute: 0 10*3/uL (ref 0.0–0.1)
EOS%: 3.1 % (ref 0.0–7.0)
Eosinophils Absolute: 0.2 10*3/uL (ref 0.0–0.5)
HCT: 39.7 % (ref 38.4–49.9)
HGB: 13.8 g/dL (ref 13.0–17.1)
MCH: 31.6 pg (ref 27.2–33.4)
MONO#: 0.4 10*3/uL (ref 0.1–0.9)
NEUT#: 3.5 10*3/uL (ref 1.5–6.5)
NEUT%: 63.3 % (ref 39.0–75.0)
RDW: 13.2 % (ref 11.0–14.6)
lymph#: 1.4 10*3/uL (ref 0.9–3.3)

## 2012-12-01 MED ORDER — IOHEXOL 300 MG/ML  SOLN
125.0000 mL | Freq: Once | INTRAMUSCULAR | Status: AC | PRN
  Administered 2012-12-01: 125 mL via INTRAVENOUS

## 2012-12-02 ENCOUNTER — Ambulatory Visit (HOSPITAL_BASED_OUTPATIENT_CLINIC_OR_DEPARTMENT_OTHER): Payer: PRIVATE HEALTH INSURANCE | Admitting: Oncology

## 2012-12-02 ENCOUNTER — Encounter: Payer: Self-pay | Admitting: Oncology

## 2012-12-02 ENCOUNTER — Telehealth: Payer: Self-pay | Admitting: Oncology

## 2012-12-02 VITALS — BP 119/82 | HR 82 | Temp 98.3°F | Resp 18 | Ht 67.0 in | Wt 233.8 lb

## 2012-12-02 DIAGNOSIS — C8589 Other specified types of non-Hodgkin lymphoma, extranodal and solid organ sites: Secondary | ICD-10-CM

## 2012-12-02 DIAGNOSIS — C8590 Non-Hodgkin lymphoma, unspecified, unspecified site: Secondary | ICD-10-CM

## 2012-12-02 NOTE — Telephone Encounter (Signed)
Gave pt appt for 6 months for MD and lab December 2014

## 2012-12-02 NOTE — Progress Notes (Signed)
This office note has been dictated.  #[id number]

## 2012-12-02 NOTE — Progress Notes (Signed)
CC:   Anna Genre. Little, M.D. Jefry H. Pollyann Kennedy, MD Sigmund I. Patsi Sears, M.D.  PROBLEM LIST: 1. Nasopharyngeal large B cell non-Hodgkin lymphoma, stage II, with     involvement of cervical and mediastinal lymph nodes as per PET scan     carried out on 09/23/2009.  Bone marrow was negative.  The patient     was treated with 6 cycles of R-CHOP in conjunction with intrathecal     methotrexate and cytarabine.  Treatments were administered from     09/17/2009 through 01/12/2010.  The patient has been in complete     remission.  His most recent CT scans were carried out on 12/01/2012     and showed no evidence of disease. 2. Dyslipidemia. 3. Benign prostatic hypertrophy. 4. Bilateral gynecomastia, right greater than left, occurring after     chemotherapy. 5. History of recurrent kidney stones with onset in 2000.    MEDICATIONS:  Reviewed and recorded. Current Outpatient Prescriptions  Medication Sig Dispense Refill  . ALPRAZolam (XANAX) 0.25 MG tablet Take 0.25 mg by mouth as needed.        [name] aspirin 81 MG tablet Take 81 mg by mouth daily.        [name] atorvastatin (LIPITOR) 20 MG tablet Take 20 mg by mouth daily.        . cetirizine (ZYRTEC) 10 MG tablet Take 10 mg by mouth daily.        . Multiple Vitamin (MULTIVITAMIN) tablet Take 1 tablet by mouth daily.        . sodium chloride (OCEAN) 0.65 % nasal spray Place 1 spray into the nose as needed. Take  1 - 2  Spays  3 - 4 x/day.        No current facility-administered medications for this visit.    SMOKING HISTORY:  The patient is a former smoker.    HISTORY:  I am seeing Ian Park today for followup of his stage II large B-cell lymphoma involving the nasopharynx, diagnosed in April 2011 when the patient had nasal stuffiness and hearing loss.  He was last seen here on 06/13/2012, and, prior to that, on 12/12/2011 by Dr. Arlan Organ.  The patient is getting along quite well without evidence of recurrent disease.  He had CT scans  yesterday that showed no evidence of disease.  His main problem has been persistent gynecomastia following chemotherapy.  This involves his right breast, more than his left breast.  He does not have any pain or discomfort.  In April, the patient also had a recurrence of kidney stones with right flank pain. He passed a small stone, which was submitted for analysis.  The patient does not recall the composition of the stone.  He is without any symptoms to suggest recurrence of his lymphoma.  He generally feels well.  He is quite active and actually has three jobs.  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   PHYSICAL EXAMINATION:  General:  He looks well, somewhat overweight. Vital Signs:  Weight is 233 pounds, 12.8 ounces, height 5 feet, 7 inches, body surface area 2.24 sq m.  Blood pressure 119/82.  Other vital signs are normal.  HEENT:  There is no scleral icterus.  Mouth and pharynx are benign.  His last ENT exam was six to nine months ago.  I have strongly urged him to be sure that he has regular followup with Dr. Serena Colonel,  who apparently sees him once a year.  There is no adenopathy in the neck, axillary, or inguinal areas.  Heart and lungs: Normal.  He does have bilateral gynecomastia, most striking on the right.  Breasts:  Nontender.  No dominant masses.  The patient did have a mammogram of his right breast on 11/07/2010, which confirmed gynecomastia without any suspicious findings.  Abdomen:  Obese with a diastasis recti, but no organomegaly or masses.  Extremities:  No peripheral edema.  Neurologic:  Normal.  He does not have a central catheter or port.  LABORATORY DATA:  Today, white count 5.5, ANC 3.5, hemoglobin 13.8, hematocrit 39.7, platelets 264,000.  Chemistries were normal, except for a glucose of 113, BUN 12, creatinine 1.1.  IMAGING STUDIES: 1. PET scan from 09/23/2009 showed intensely  hypermetabolic posterior     nasopharyngeal bulky mass, consistent with lymphoma.  There were     cervical node metastases involving the left greater than the right     and two small hypermetabolic mediastinal nodal metastases.  No     hypermetabolic areas below the diaphragm. 2. PET scan on 11/03/2009 showed interval complete response to therapy     with no residual hypermetabolic masses or lymphadenopathy. 3. Digital diagnostic right mammogram on 11/07/2010 showed     gynecomastia with no mass, distortion, or calcifications to suggest     malignancy. 4. CT scan of chest, abdomen, and pelvis with IV contrast on     12/01/2012 showed no specific features to suggest residual or     recurrent lymphoma.  A 4 mm stone was identified within the urinary     bladder, near the right UVJ. 5. CT scan of the neck with IV contrast on 12/01/2012 showed no     evidence for recurrent lymphadenopathy.  There was chronic fullness     of the soft tissues of the left posterior nasopharynx at the site     of the previous treated disease.    IMPRESSION AND PLAN:  Mr. Ian Park is now out past three years from the time of diagnosis without evidence of recurrent disease.  He had been getting CT scans about every six months.  I had a discussion with him about the usefulness of further scanning.  Frankly, I am inclined to follow him clinically at this point.  The patient is having to pay out of pocket, around 2000 dollars for all of these scans, 500 dollars per CT scan. There are also the concerns about radiation exposure and the actual usefulness of scanning at this time.  I reassured him that the likelihood for recurrent disease is low at this point.  I emphasized the importance of ongoing followup with ENT exams by Dr. Pollyann Kennedy.  The patient thinks his last exam was six to nine months ago.  The patient has bilateral gynecomastia, right greater than left, which apparently started during or after his chemotherapy.   We will arrange for him to have an evaluation by an endocrine specialist to rule out other possibilities for gynecomastia, especially asymmetric gynecomastia.  The patient may benefit from some therapy.  There have been reports that tamoxifen is beneficial in treating gynecomastia.  I have asked Mr. Bennick to return in six months, at which time we will check CBC and chemistries.    ______________________________ Samul Dada, M.D. DSM/MEDQ  D:  12/02/2012  T:  12/02/2012  Job:  [id number]

## 2012-12-04 ENCOUNTER — Telehealth: Payer: Self-pay

## 2012-12-04 ENCOUNTER — Other Ambulatory Visit: Payer: Self-pay

## 2012-12-04 ENCOUNTER — Other Ambulatory Visit: Payer: Self-pay | Admitting: Oncology

## 2012-12-04 DIAGNOSIS — C8590 Non-Hodgkin lymphoma, unspecified, unspecified site: Secondary | ICD-10-CM

## 2012-12-04 NOTE — Telephone Encounter (Signed)
S/w pt to expect a call from Dr Candie Chroman who is an endocrinologist.

## 2012-12-05 ENCOUNTER — Telehealth: Payer: Self-pay | Admitting: Oncology

## 2012-12-16 ENCOUNTER — Telehealth: Payer: Self-pay | Admitting: Oncology

## 2013-05-25 ENCOUNTER — Other Ambulatory Visit: Payer: Self-pay | Admitting: Dermatology

## 2013-06-08 ENCOUNTER — Other Ambulatory Visit: Payer: Self-pay | Admitting: *Deleted

## 2013-06-08 ENCOUNTER — Other Ambulatory Visit: Payer: PRIVATE HEALTH INSURANCE | Admitting: Lab

## 2013-06-08 ENCOUNTER — Ambulatory Visit: Payer: PRIVATE HEALTH INSURANCE

## 2013-06-08 DIAGNOSIS — C8590 Non-Hodgkin lymphoma, unspecified, unspecified site: Secondary | ICD-10-CM

## 2013-06-09 ENCOUNTER — Other Ambulatory Visit: Payer: PRIVATE HEALTH INSURANCE

## 2013-06-09 ENCOUNTER — Ambulatory Visit (HOSPITAL_BASED_OUTPATIENT_CLINIC_OR_DEPARTMENT_OTHER): Payer: Self-pay | Admitting: Oncology

## 2013-06-09 ENCOUNTER — Telehealth: Payer: Self-pay | Admitting: *Deleted

## 2013-06-09 DIAGNOSIS — C8589 Other specified types of non-Hodgkin lymphoma, extranodal and solid organ sites: Secondary | ICD-10-CM

## 2013-06-09 NOTE — Progress Notes (Signed)
62 year old man with history of high-grade B cell lymphoma presenting with a nasopharyngeal mass in 2011. He was initially managed by Dr.Odogwu, and saw Dr. Fortino Sic for his most recent followup visit on 12/02/2012. Since I will be leaving the practice in the spring, I felt it would be better to assign him to a doctor who is going to remain with practice rather than have him followup with me for just one time.

## 2013-06-09 NOTE — Telephone Encounter (Signed)
Lm informed the pt that i was returning hios call. i asked him to give me a call so that i can help assist him...td

## 2013-08-08 ENCOUNTER — Telehealth: Payer: Self-pay | Admitting: *Deleted

## 2013-08-08 NOTE — Telephone Encounter (Signed)
Former pt of DR. G...td  

## 2013-09-17 ENCOUNTER — Telehealth: Payer: Self-pay | Admitting: Oncology

## 2013-09-17 ENCOUNTER — Ambulatory Visit (HOSPITAL_BASED_OUTPATIENT_CLINIC_OR_DEPARTMENT_OTHER): Payer: 59 | Admitting: Oncology

## 2013-09-17 ENCOUNTER — Encounter: Payer: Self-pay | Admitting: Oncology

## 2013-09-17 VITALS — BP 135/84 | HR 78 | Temp 97.7°F | Resp 17 | Ht 67.0 in | Wt 233.5 lb

## 2013-09-17 DIAGNOSIS — C8590 Non-Hodgkin lymphoma, unspecified, unspecified site: Secondary | ICD-10-CM

## 2013-09-17 DIAGNOSIS — C8581 Other specified types of non-Hodgkin lymphoma, lymph nodes of head, face, and neck: Secondary | ICD-10-CM

## 2013-09-17 DIAGNOSIS — N62 Hypertrophy of breast: Secondary | ICD-10-CM

## 2013-09-17 NOTE — Telephone Encounter (Signed)
lvm for pt regarding to April 2016 appt....mailed pt appt sched/avs and letter °

## 2013-09-17 NOTE — Progress Notes (Signed)
Hematology and Oncology Follow Up Visit  Ian Park [id number] 09-28-1950 63 y.o. 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 He presented with cervical and mediastinal lymphadenopathy and found to have stage II disease.   Prior Therapy: He is status post 6 cycles of CHOP with rituximab as well as CNS prophylaxis with methotrexate and cytarabine intrathecally completed in August of 2011. He continued to be in remission at this time.  Current therapy: Observation and surveillance.  Interim History:  This is a pleasant 63 year old gentleman with the above history presents today for a followup visit. He is a gentleman have been seen in the past by previous oncologist here in the practice and now establishing care with me. He continues to do well without any signs of symptoms to suggest relapse disease. He has not reported any fevers or chills or sweats. Has not reported any lymphadenopathy or dysphagia. Has not reported any altered mental status psychiatric issues or depression. Does not report any abdominal pain or distention. He does report nocturia have been diagnosed with BPH.  Medications: I have reviewed the patient's current medications.  Current Outpatient Prescriptions  Medication Sig Dispense Refill  . ALPRAZolam (XANAX) 0.25 MG tablet Take 0.25 mg by mouth as needed.        [name] aspirin 81 MG tablet Take 81 mg by mouth daily.        [name] atorvastatin (LIPITOR) 20 MG tablet Take 20 mg by mouth daily.        . cetirizine (ZYRTEC) 10 MG tablet Take 10 mg by mouth daily.        . ciprofloxacin (CIPRO) 500 MG tablet       . Multiple Vitamin (MULTIVITAMIN) tablet Take 1 tablet by mouth daily.        . sodium chloride (OCEAN) 0.65 % nasal spray Place 1 spray into the nose as needed. Take  1 - 2  Spays  3 - 4 x/day.        No current facility-administered medications  for this visit.     Allergies: Not on File  Past Medical History, Surgical history, Social history, and Family History were reviewed and updated.  Review of Systems: Constitutional:  Negative for fever, chills, night sweats, anorexia, weight loss, pain. Cardiovascular: no chest pain or dyspnea on exertion Respiratory: no cough, shortness of breath, or wheezing Neurological: no TIA or stroke symptoms Dermatological: negative ENT: negative for - epistaxis, sinus pain, sore throat, tinnitus or vertigo Skin: Negative. Gastrointestinal: no abdominal pain, change in bowel habits, or black or bloody stools Genito-Urinary: no dysuria, trouble voiding, or hematuria Hematological and Lymphatic: negative Breast: positive for -enlarged right breast which have not changed. Musculoskeletal: negative Remaining ROS negative. Physical Exam: Blood pressure 135/84, pulse 78, temperature 97.7 F (36.5 C), temperature source Oral, resp. rate 17, height 5\' 7"  (1.702 m), weight 233 lb 8 oz (105.915 kg), SpO2 94.00%. ECOG: 0 General appearance: alert, cooperative and appears stated age Head: Normocephalic, without obvious abnormality, atraumatic Neck: no adenopathy, no carotid bruit, no JVD, supple, symmetrical, trachea midline and thyroid not enlarged, symmetric, no tenderness/mass/nodules Lymph nodes: Cervical, supraclavicular, and axillary nodes normal. Heart:regular rate and rhythm, S1, S2 normal, no murmur, click, rub or gallop Lung:chest clear, no wheezing, rales, normal symmetric air entry Abdomin: soft, non-tender, without masses or organomegaly EXT:no erythema, induration, or nodules   Lab Results: Lab Results  Component  Value Date   WBC 5.5 12/01/2012   HGB 13.8 12/01/2012   HCT 39.7 12/01/2012   MCV 91.1 12/01/2012   PLT 264 12/01/2012     Chemistry      Component Value Date/Time   NA 140 12/01/2012 0806   NA 138 09/24/2012 2248   NA 139 12/05/2011 1544   K 4.1 12/01/2012 0806   K 4.3  09/24/2012 2248   K 4.3 12/05/2011 1544   CL 105 12/01/2012 0806   CL 101 09/24/2012 2248   CL 99 12/05/2011 1544   CO2 22 12/01/2012 0806   CO2 25 09/24/2012 2248   CO2 29 12/05/2011 1544   BUN 11.6 12/01/2012 0806   BUN 17 09/24/2012 2248   BUN 12 12/05/2011 1544   CREATININE 1.1 12/01/2012 0806   CREATININE 1.50* 09/24/2012 2248   CREATININE 0.9 12/05/2011 1544      Component Value Date/Time   CALCIUM 9.1 12/01/2012 0806   CALCIUM 9.6 09/24/2012 2248   CALCIUM 9.0 12/05/2011 1544   ALKPHOS 68 12/01/2012 0806   ALKPHOS 71 12/05/2011 1544   ALKPHOS 57 02/23/2011 1034   AST 20 12/01/2012 0806   AST 30 12/05/2011 1544   AST 20 02/23/2011 1034   ALT 19 12/01/2012 0806   ALT 35 12/05/2011 1544   ALT 25 02/23/2011 1034   BILITOT 0.68 12/01/2012 0806   BILITOT 0.70 12/05/2011 1544   BILITOT 0.7 02/23/2011 1034         Impression and Plan:   63 year old gentleman with the following issues:  1. Nasopharyngeal B-cell non-Hodgkin's lymphoma stage II diagnosed in 2011. He is status post systemic chemotherapy with CHOP and rituximab as well as CNS prophylaxis and continue to be in remission since August of 2011. His last PET/CT scan in June of 2014 did not show any evidence of disease. At this time, I see no really need for any imaging studies routinely but certainly if he develops any symptoms of dysphagia or lymphadenopathy I will be a reason to repeat scan him. We will continue to follow him clinically and a physical examination once a year.  2. Gynecomastia: Have not really changed likely at this time.  3. BPH: He following up with urology for care and prostate cancer screening.  4. Followup will be in one year sooner if needed to.   Wyatt Portela, MD 4/9/20159:13 AM

## 2014-04-30 ENCOUNTER — Telehealth: Payer: Self-pay | Admitting: Oncology

## 2014-04-30 NOTE — Telephone Encounter (Signed)
Lvm advising appt chg from 4/8 ([facility] clinic) to 4/7 @ 9am. Also mailed revised appt calendar.

## 2014-08-04 ENCOUNTER — Other Ambulatory Visit: Payer: Self-pay | Admitting: Family Medicine

## 2014-08-04 DIAGNOSIS — N62 Hypertrophy of breast: Secondary | ICD-10-CM

## 2014-08-25 ENCOUNTER — Ambulatory Visit
Admission: RE | Admit: 2014-08-25 | Discharge: 2014-08-25 | Disposition: A | Discharge status: Home / Self Care | Payer: 59 | Source: Ambulatory Visit | Attending: Family Medicine | Admitting: Family Medicine

## 2014-08-25 DIAGNOSIS — N62 Hypertrophy of breast: Secondary | ICD-10-CM

## 2014-09-15 ENCOUNTER — Telehealth: Payer: Self-pay | Admitting: Oncology

## 2014-09-15 NOTE — Telephone Encounter (Signed)
Patient called in to reschedule his appointment

## 2014-09-16 ENCOUNTER — Ambulatory Visit: Payer: 59 | Admitting: Oncology

## 2014-09-17 ENCOUNTER — Ambulatory Visit: Payer: 59 | Admitting: Oncology

## 2014-09-21 ENCOUNTER — Ambulatory Visit (HOSPITAL_BASED_OUTPATIENT_CLINIC_OR_DEPARTMENT_OTHER): Payer: 59 | Admitting: Oncology

## 2014-09-21 ENCOUNTER — Telehealth: Payer: Self-pay | Admitting: Oncology

## 2014-09-21 VITALS — BP 128/76 | HR 80 | Temp 98.8°F | Resp 18 | Ht 67.0 in | Wt 234.2 lb

## 2014-09-21 DIAGNOSIS — Z8572 Personal history of non-Hodgkin lymphomas: Secondary | ICD-10-CM | POA: Diagnosis not present

## 2014-09-21 DIAGNOSIS — C8590 Non-Hodgkin lymphoma, unspecified, unspecified site: Secondary | ICD-10-CM

## 2014-09-21 NOTE — Telephone Encounter (Signed)
gv and printed appt sched and avs for pt for April 2017

## 2014-09-21 NOTE — Progress Notes (Signed)
Hematology and Oncology Follow Up Visit  Ian Park [id number] [birth year] 64 y.o. 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 He presented with cervical and mediastinal lymphadenopathy and found to have stage II disease.   Prior Therapy: He is status post 6 cycles of CHOP with rituximab as well as CNS prophylaxis with methotrexate and cytarabine intrathecally completed in August of 2011. He continued to be in remission at this time.  Current therapy: Observation and surveillance.  Interim History:  Ian Park presents today for a followup visit. Since the last visit he reports no complaints.. He continues to do well without any signs of symptoms to suggest relapse disease. He has not reported any fevers or chills or sweats. Has not reported any lymphadenopathy or dysphagia. Has not reported any altered mental status psychiatric issues or depression. Does not report any abdominal pain or distention. 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 He continues to work full time at the [employer] without any decline. He still ambulatory and limited at times with hip pain. The remaining review of systems unremarkable.  Medications: I have reviewed the patient's current medications.  Current Outpatient Prescriptions  Medication Sig Dispense Refill  . ALPRAZolam (XANAX) 0.25 MG tablet Take 0.25 mg by mouth as needed.      [name] aspirin 81 MG tablet Take 81 mg by mouth daily.      [name] atorvastatin (LIPITOR) 20 MG tablet Take 20 mg by mouth daily.      . cetirizine (ZYRTEC) 10 MG tablet Take 10 mg by mouth daily.      . ciprofloxacin (CIPRO) 500 MG tablet     . Multiple Vitamin (MULTIVITAMIN) tablet Take 1 tablet by mouth daily.      . sodium chloride (OCEAN) 0.65 % nasal spray Place 1 spray into  the nose as needed. Take  1 - 2  Spays  3 - 4 x/day.      No current facility-administered medications for this visit.     Allergies: Not on File  Past Medical History, Surgical history, Social history, and Family History were reviewed and updated.   Physical Exam: Blood pressure 128/76, pulse 80, temperature 98.8 F (37.1 C), temperature source Oral, resp. rate 18, height 5\' 7"  (1.702 m), weight 234 lb 3.2 oz (106.232 kg), SpO2 97 %. ECOG: 0 General appearance: alert and cooperative Head: Normocephalic, without obvious abnormality, atraumatic Neck: no adenopathy Lymph nodes: Cervical, supraclavicular, and axillary nodes normal. Heart:regular rate and rhythm, S1, S2 normal, no murmur, click, rub or gallop Lung:chest clear, no wheezing, rales, normal symmetric air entry Abdomin: soft, non-tender, without masses or organomegaly EXT:no erythema, induration, or nodules   Lab Results: Lab Results  Component Value Date   WBC 5.5 12/01/2012   HGB 13.8 12/01/2012   HCT 39.7 12/01/2012   MCV 91.1 12/01/2012   PLT 264 12/01/2012     Chemistry      Component Value Date/Time   NA 140 12/01/2012 0806   NA 138 09/24/2012 2248   NA 139 12/05/2011 1544   K 4.1 12/01/2012 0806   K 4.3 09/24/2012 2248   K 4.3 12/05/2011 1544   CL 105 12/01/2012 0806   CL 101 09/24/2012 2248   CL 99 12/05/2011 1544   CO2 22 12/01/2012 0806   CO2 25 09/24/2012  2248   CO2 29 12/05/2011 1544   BUN 11.6 12/01/2012 0806   BUN 17 09/24/2012 2248   BUN 12 12/05/2011 1544   CREATININE 1.1 12/01/2012 0806   CREATININE 1.50* 09/24/2012 2248   CREATININE 0.9 12/05/2011 1544      Component Value Date/Time   CALCIUM 9.1 12/01/2012 0806   CALCIUM 9.6 09/24/2012 2248   CALCIUM 9.0 12/05/2011 1544   ALKPHOS 68 12/01/2012 0806   ALKPHOS 71 12/05/2011 1544   ALKPHOS 57 02/23/2011 1034   AST 20 12/01/2012 0806   AST 30 12/05/2011 1544   AST 20 02/23/2011 1034   ALT 19 12/01/2012 0806   ALT 35 12/05/2011  1544   ALT 25 02/23/2011 1034   BILITOT 0.68 12/01/2012 0806   BILITOT 0.70 12/05/2011 1544   BILITOT 0.7 02/23/2011 1034         Impression and Plan:   64 year old gentleman with the following issues:  1. Nasopharyngeal B-cell non-Hodgkin's lymphoma stage II diagnosed in 2011. He is status post systemic chemotherapy with CHOP and rituximab as well as CNS prophylaxis and continue to be in remission since August of 2011. His last PET/CT scan in June of 2014 did not show any evidence of disease. Clinically he remains to be stable without any evidence of recurrent disease on his physical examination. The plan is to continue with active surveillance on an annual basis. I'll repeat imaging studies in the future as needed.   2. Hip pain: Related to avascular necrosis likely require hip replacement.  3. BPH: He following up with urology for care and prostate cancer screening.  4. Followup will be in one year sooner if needed to.   [facility], MD 4/12/20163:47 PM

## 2015-05-03 ENCOUNTER — Ambulatory Visit: Payer: Self-pay | Admitting: Orthopedic Surgery

## 2015-05-03 NOTE — Progress Notes (Signed)
Preoperative surgical orders have been place into the Epic hospital system for [place] on 05/03/2015, 5:41 PM  by Mickel Crow for surgery on 05-30-15.  Preop Total Hip - Anterior Approach orders including IV Tylenol, and IV Decadron as long as there are no contraindications to the above medications. Arlee Muslim, PA-C

## 2015-05-23 ENCOUNTER — Other Ambulatory Visit (HOSPITAL_COMMUNITY): Payer: Self-pay | Admitting: *Deleted

## 2015-05-23 NOTE — Patient Instructions (Addendum)
Ian Park  05/23/2015   Your procedure is scheduled on: 05-30-15  Report to [facility] Main  Entrance take [facility]  elevators to 3rd floor to  [place] at 1150 AM.  Call this number if you have problems the morning of surgery [phone]   Remember: ONLY 1 PERSON MAY GO WITH YOU TO SHORT STAY TO GET  READY MORNING OF [place].  Do not eat food  :After Midnight, clear liquids until 850 am day of surgery, no clear liquids after 850 am day of surgery.     Take these medicines the morning of surgery with A SIP OF WATER: Atorvastatin (Lipitor), Certrizine (Zyrtec)[place] Nasall spray if needed                               You may not have any metal on your body including hair pins and              piercings  Do not wear jewelry, make-up, lotions, powders or perfumes, deodorant             Do not wear nail polish.  Do not shave  48 hours prior to surgery.              Men may shave face and neck.   Do not bring valuables to the hospital. [place].  Contacts, dentures or bridgework may not be worn into surgery.  Leave suitcase in the car. After surgery it may be brought to your room.   _____________________________________________________________________                CLEAR LIQUID DIET   Foods Allowed                                                                     Foods Excluded  Coffee and tea, regular and decaf                             liquids that you cannot  Plain Jell-O in any flavor                                             see through such as: Fruit ices (not with fruit pulp)                                     milk, soups, orange juice  Iced Popsicles                                    All solid food Carbonated beverages, regular and diet  Cranberry, grape and apple juices Sports drinks like Gatorade Lightly seasoned clear broth or  consume(fat free) Sugar, honey syrup  Sample Menu Breakfast                                Lunch                                     Supper Cranberry juice                    Beef broth                            Chicken broth Jell-O                                     Grape juice                           Apple juice Coffee or tea                        Jell-O                                      Popsicle                                                Coffee or tea                        Coffee or tea  _____________________________________________________________________    Incentive Spirometer  An incentive spirometer is a tool that can help keep your lungs clear and active. This tool measures how well you are filling your lungs with each breath. Taking long deep breaths may help reverse or decrease the chance of developing breathing (pulmonary) problems (especially infection) following:  A long period of time when you are unable to move or be active. BEFORE THE PROCEDURE   If the spirometer includes an indicator to show your best effort, your nurse or respiratory therapist will set it to a desired goal.  If possible, sit up straight or lean slightly forward. Try not to slouch.  Hold the incentive spirometer in an upright position. INSTRUCTIONS FOR USE   Sit on the edge of your bed if possible, or sit up as far as you can in bed or on a chair.  Hold the incentive spirometer in an upright position.  Breathe out normally.  Place the mouthpiece in your mouth and seal your lips tightly around it.  Breathe in slowly and as deeply as possible, raising the piston or the ball toward the top of the column.  Hold your breath for 3-5 seconds or for as long as possible. Allow the piston or ball to fall to the bottom of the column.  Remove the mouthpiece from your mouth and breathe out normally.  Rest for a few seconds and repeat Steps 1 through 7 at  least 10 times every 1-2 hours when  you are awake. Take your time and take a few normal breaths between deep breaths.  The spirometer may include an indicator to show your best effort. Use the indicator as a goal to work toward during each repetition.  After each set of 10 deep breaths, practice coughing to be sure your lungs are clear. If you have an incision (the cut made at the time of surgery), support your incision when coughing by placing a pillow or rolled up towels firmly against it. Once you are able to get out of bed, walk around indoors and cough well. You may stop using the incentive spirometer when instructed by your caregiver.  RISKS AND COMPLICATIONS  Take your time so you do not get dizzy or light-headed.  If you are in pain, you may need to take or ask for pain medication before doing incentive spirometry. It is harder to take a deep breath if you are having pain. AFTER USE  Rest and breathe slowly and easily.  It can be helpful to keep track of a log of your progress. Your caregiver can provide you with a simple table to help with this. If you are using the spirometer at home, follow these instructions: [place] IF:   You are having difficultly using the spirometer.  You have trouble using the spirometer as often as instructed.  Your pain medication is not giving enough relief while using the spirometer.  You develop fever of 100.5 F (38.1 C) or higher. SEEK IMMEDIATE MEDICAL CARE IF:   You cough up bloody sputum that had not been present before.  You develop fever of 102 F (38.9 C) or greater.  You develop worsening pain at or near the incision site. MAKE SURE YOU:   Understand these instructions.  Will watch your condition.  Will get help right away if you are not doing well or get worse. Document Released: 10/08/2006 Document Revised: 08/20/2011 Document Reviewed: 12/09/2006 ExitCare Patient Information 2014 ExitCare,  [place].   ________________________________________________________________________  WHAT IS A BLOOD TRANSFUSION? Blood Transfusion Information  A transfusion is the replacement of blood or some of its parts. Blood is made up of multiple cells which provide different functions.  Red blood cells carry oxygen and are used for blood loss replacement.  White blood cells fight against infection.  Platelets control bleeding.  Plasma helps clot blood.  Other blood products are available for specialized needs, such as hemophilia or other clotting disorders. BEFORE THE TRANSFUSION  Who gives blood for transfusions?   Healthy volunteers who are fully evaluated to make sure their blood is safe. This is blood bank blood. Transfusion therapy is the safest it has ever been in the practice of medicine. Before blood is taken from a donor, a complete history is taken to make sure that person has no history of diseases nor engages in risky social behavior (examples are intravenous drug use or sexual activity with multiple partners). The donor's travel history is screened to minimize risk of transmitting infections, such as malaria. The donated blood is tested for signs of infectious diseases, such as HIV and hepatitis. The blood is then tested to be sure it is compatible with you in order to minimize the chance of a transfusion reaction. If you or a relative donates blood, this is often done in anticipation of surgery and is not appropriate for emergency situations. It takes many days to process the donated blood. RISKS AND COMPLICATIONS Although transfusion  therapy is very safe and saves many lives, the main dangers of transfusion include:   Getting an infectious disease.  Developing a transfusion reaction. This is an allergic reaction to something in the blood you were given. Every precaution is taken to prevent this. The decision to have a blood transfusion has been considered carefully by your caregiver  before blood is given. Blood is not given unless the benefits outweigh the risks. AFTER THE TRANSFUSION  Right after receiving a blood transfusion, you will usually feel much better and more energetic. This is especially true if your red blood cells have gotten low (anemic). The transfusion raises the level of the red blood cells which carry oxygen, and this usually causes an energy increase.  The nurse administering the transfusion will monitor you carefully for complications. HOME CARE INSTRUCTIONS  No special instructions are needed after a transfusion. You may find your energy is better. Speak with your caregiver about any limitations on activity for underlying diseases you may have. SEEK MEDICAL CARE IF:   Your condition is not improving after your transfusion.  You develop redness or irritation at the intravenous (IV) site. SEEK IMMEDIATE MEDICAL CARE IF:  Any of the following symptoms occur over the next 12 hours:  Shaking chills.  You have a temperature by mouth above 102 F (38.9 C), not controlled by medicine.  Chest, back, or muscle pain.  People around you feel you are not acting correctly or are confused.  Shortness of breath or difficulty breathing.  Dizziness and fainting.  You get a rash or develop hives.  You have a decrease in urine output.  Your urine turns a dark color or changes to pink, red, or brown. Any of the following symptoms occur over the next 10 days:  You have a temperature by mouth above 102 F (38.9 C), not controlled by medicine.  Shortness of breath.  Weakness after normal activity.  The white part of the eye turns yellow (jaundice).  You have a decrease in the amount of urine or are urinating less often.  Your urine turns a dark color or changes to pink, red, or brown. Document Released: 05/25/2000 Document Revised: 08/20/2011 Document Reviewed: 01/12/2008 ExitCare Patient Information 2014 [place].  _______________________________________________________________________[facility] - Preparing for Surgery Before surgery, you can play an important role.  Because skin is not sterile, your skin needs to be as free of germs as possible.  You can reduce the number of germs on your skin by washing with CHG (chlorahexidine gluconate) soap before surgery.  CHG is an antiseptic cleaner which kills germs and bonds with the skin to continue killing germs even after washing. Please DO NOT use if you have an allergy to CHG or antibacterial soaps.  If your skin becomes reddened/irritated stop using the CHG and inform your nurse when you arrive at Short Stay. Do not shave (including legs and underarms) for at least 48 hours prior to the first CHG shower.  You may shave your face/neck. Please follow these instructions carefully:  1.  Shower with CHG Soap the night before surgery and the  morning of Surgery.  2.  If you choose to wash your hair, wash your hair first as usual with your  normal  shampoo.  3.  After you shampoo, rinse your hair and body thoroughly to remove the  shampoo.  4.  Use CHG as you would any other liquid soap.  You can apply chg directly  to the skin and wash                       Gently with a scrungie or clean washcloth.  5.  Apply the CHG Soap to your body ONLY FROM THE NECK DOWN.   Do not use on face/ open                           Wound or open sores. Avoid contact with eyes, ears mouth and genitals (private parts).                       Wash face,  Genitals (private parts) with your normal soap.             6.  Wash thoroughly, paying special attention to the area where your surgery  will be performed.  7.  Thoroughly rinse your body with warm water from the neck down.  8.  DO NOT shower/wash with your normal soap after using and rinsing off  the CHG Soap.                9.  Pat yourself dry with a clean towel.            10.  Wear clean pajamas.             11.  Place clean sheets on your bed the night of your first shower and do not  sleep with pets. Day of Surgery : Do not apply any lotions/deodorants the morning of surgery.  Please wear clean clothes to the hospital/surgery center.  FAILURE TO FOLLOW THESE INSTRUCTIONS MAY RESULT IN THE CANCELLATION OF YOUR SURGERY PATIENT SIGNATURE_________________________________  NURSE SIGNATURE__________________________________  ________________________________________________________________________

## 2015-05-23 NOTE — Progress Notes (Signed)
ekg 03-25-15 dr [name] little on chart Medical clearcne note dr [name] little on chart lov dr [name] little 03-25-15 on chart

## 2015-05-26 ENCOUNTER — Encounter (HOSPITAL_COMMUNITY)
Admission: RE | Admit: 2015-05-26 | Discharge: 2015-05-26 | Disposition: A | Discharge status: Home / Self Care | Payer: No Typology Code available for payment source | Source: Ambulatory Visit | Attending: Orthopedic Surgery | Admitting: Orthopedic Surgery

## 2015-05-26 ENCOUNTER — Encounter (HOSPITAL_COMMUNITY): Payer: Self-pay

## 2015-05-26 DIAGNOSIS — M1612 Unilateral primary osteoarthritis, left hip: Secondary | ICD-10-CM | POA: Diagnosis not present

## 2015-05-26 DIAGNOSIS — Z01818 Encounter for other preprocedural examination: Secondary | ICD-10-CM | POA: Insufficient documentation

## 2015-05-26 HISTORY — DX: Other complications of anesthesia, initial encounter: T88.59XA

## 2015-05-26 HISTORY — DX: Unspecified injury of unspecified lower leg, initial encounter: S89.90XA

## 2015-05-26 HISTORY — DX: Unspecified osteoarthritis, unspecified site: M19.90

## 2015-05-26 HISTORY — DX: Personal history of antineoplastic chemotherapy: Z92.21

## 2015-05-26 HISTORY — DX: Headache: R51

## 2015-05-26 HISTORY — DX: Congenital non-neoplastic nevus: Q82.5

## 2015-05-26 HISTORY — DX: Headache, unspecified: R51.9

## 2015-05-26 HISTORY — DX: Adverse effect of unspecified anesthetic, initial encounter: T41.45XA

## 2015-05-26 LAB — COMPREHENSIVE METABOLIC PANEL
ALT: 21 U/L (ref 17–63)
ANION GAP: 8 (ref 5–15)
AST: 22 U/L (ref 15–41)
Albumin: 4.3 g/dL (ref 3.5–5.0)
Alkaline Phosphatase: 64 U/L (ref 38–126)
BILIRUBIN TOTAL: 0.7 mg/dL (ref 0.3–1.2)
BUN: 19 mg/dL (ref 6–20)
CO2: 27 mmol/L (ref 22–32)
Calcium: 9.2 mg/dL (ref 8.9–10.3)
Chloride: 107 mmol/L (ref 101–111)
Creatinine, Ser: 0.98 mg/dL (ref 0.61–1.24)
Glucose, Bld: 121 mg/dL — ABNORMAL HIGH (ref 65–99)
POTASSIUM: 4.6 mmol/L (ref 3.5–5.1)
Sodium: 142 mmol/L (ref 135–145)
TOTAL PROTEIN: 6.9 g/dL (ref 6.5–8.1)

## 2015-05-26 LAB — PROTIME-INR
INR: 1.02 (ref 0.00–1.49)
PROTHROMBIN TIME: 13.6 s (ref 11.6–15.2)

## 2015-05-26 LAB — CBC
HEMATOCRIT: 44.3 % (ref 39.0–52.0)
Hemoglobin: 14.4 g/dL (ref 13.0–17.0)
MCH: 31 pg (ref 26.0–34.0)
MCHC: 32.5 g/dL (ref 30.0–36.0)
MCV: 95.3 fL (ref 78.0–100.0)
Platelets: 269 10*3/uL (ref 150–400)
RBC: 4.65 MIL/uL (ref 4.22–5.81)
RDW: 13.6 % (ref 11.5–15.5)
WBC: 4.7 10*3/uL (ref 4.0–10.5)

## 2015-05-26 LAB — URINALYSIS, ROUTINE W REFLEX MICROSCOPIC
BILIRUBIN URINE: NEGATIVE
Glucose, UA: NEGATIVE mg/dL
Ketones, ur: NEGATIVE mg/dL
Leukocytes, UA: NEGATIVE
NITRITE: NEGATIVE
PH: 5.5 (ref 5.0–8.0)
Protein, ur: NEGATIVE mg/dL
SPECIFIC GRAVITY, URINE: 1.023 (ref 1.005–1.030)

## 2015-05-26 LAB — URINE MICROSCOPIC-ADD ON

## 2015-05-26 LAB — SURGICAL PCR SCREEN
MRSA, PCR: NEGATIVE
Staphylococcus aureus: NEGATIVE

## 2015-05-26 LAB — ABO/RH: ABO/RH(D): A POS

## 2015-05-26 LAB — APTT: aPTT: 31 seconds (ref 24–37)

## 2015-05-26 NOTE — Progress Notes (Signed)
UA AND MICRO RESULTS FAXED BY EPIC TO DR Wynelle Link

## 2015-05-26 NOTE — H&P (Signed)
TOTAL HIP ADMISSION H&P  Patient is admitted for left total hip arthroplasty.  Subjective:  Chief Complaint: left hip pain  HPI: Ian Park, 64 y.o. male, has a history of pain and functional disability in the left hip(s) due to arthritis and patient has failed non-surgical conservative treatments for greater than 12 weeks to include NSAID's and/or analgesics, corticosteriod injections and activity modification.  Onset of symptoms was gradual starting 2 years ago with gradually worsening course since that time.The patient noted no past surgery on the left hip(s).  Patient currently rates pain in the left hip at 8 out of 10 with activity. Patient has night pain, worsening of pain with activity and weight bearing, pain that interfers with activities of daily living and pain with passive range of motion. Patient has evidence of periarticular osteophytes and joint space narrowing by imaging studies. This condition presents safety issues increasing the risk of falls.   There is no current active infection.  Patient Active Problem List   Diagnosis Date Noted  . Lymphoma ([place]) 12/12/2011   Past Medical History  Diagnosis Date  . 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 Cancer ([place])     lymphoma  . Hypercholesterolemia   . Kidney stone   . History of chemotherapy 2011  . Headache   . Arthritis     oa, hips and right shoulder  . Complication of anesthesia     "slow to wake up and PEE"  . Birthmark     right leg portwine stain birthmark  . Shin injury     red area no drainage pt says present for last 30 years    Past Surgical History  Procedure Laterality Date  . Knee arthroscopy  1982    Right knee  . Shoulder arthroscopy  1986    Right shoulder  . Shoulder arthroscopy  1993    Left shoulder  . Rotator cuff repair  1997    Right shoulder   . Rotator cuff repair Left 1986     Current outpatient prescriptions:  .  ALPRAZolam (XANAX) 0.25 MG tablet, Take 0.25 mg  by mouth at bedtime as needed for sleep. , Disp: , Rfl:  .  aspirin EC 81 MG tablet, Take 81 mg by mouth daily., Disp: , Rfl:  .  atorvastatin (LIPITOR) 20 MG tablet, Take 20 mg by mouth daily.  , Disp: , Rfl:  .  cetirizine (ZYRTEC) 10 MG tablet, Take 10 mg by mouth daily.  , Disp: , Rfl:  .  DiphenhydrAMINE HCl (BENADRYL PO), Take 1 tablet by mouth daily as needed (for allergies)., Disp: , Rfl:  .  HYDROcodone-acetaminophen (NORCO/VICODIN) 5-325 MG tablet, Take 0.5 tablets by mouth every 6 (six) hours as needed (pain)., Disp: , Rfl:  .  meloxicam (MOBIC) 15 MG tablet, Take 1 tablet by mouth daily., Disp: , Rfl: 2 .  Multiple Vitamin (MULTIVITAMIN) tablet, Take 0.5 tablets by mouth 2 (two) times daily. , Disp: , Rfl:  .  Saw Palmetto, Serenoa repens, (SAW PALMETTO PO), Take 1 tablet by mouth at bedtime., Disp: , Rfl:   No Known Allergies  Social History  Substance Use Topics  . Smoking status: Former Smoker -- 1.00 packs/day for 10 years    Types: Cigars, Pipe    Quit date: 06/11/2004  . Smokeless tobacco: Never Used  . Alcohol Use: 3.0 oz/week    5 Shots of liquor per week     Comment: 1 drink per  day    Family History  Problem Relation Age of Onset  . Cataracts Mother   . Heart attack Father   . Cholelithiasis Brother      Review of Systems  Constitutional: Negative.   HENT: Negative.   Eyes: Negative.   Respiratory: Negative.   Cardiovascular: Negative.   Gastrointestinal: Negative.   Genitourinary: Positive for frequency. Negative for dysuria, urgency, hematuria and flank pain.  Musculoskeletal: Positive for joint pain. Negative for myalgias, back pain, falls and neck pain.       Left hip pain  Skin: Negative.   Neurological: Negative.   Endo/Heme/Allergies: Negative.   Psychiatric/Behavioral: Negative.     Objective:  Physical Exam  Constitutional: He is oriented to person, place, and time. He appears well-developed. No distress.  Overweight  HENT:  Head:  Normocephalic and atraumatic.  Right Ear: External ear normal.  Left Ear: External ear normal.  Nose: Nose normal.  Mouth/Throat: Oropharynx is clear and moist.  Eyes: Conjunctivae and EOM are normal.  Neck: Normal range of motion. Neck supple.  Cardiovascular: Normal rate, regular rhythm, normal heart sounds and intact distal pulses.   No murmur heard. Respiratory: Effort normal and breath sounds normal. No respiratory distress. He has no wheezes.  GI: Soft. Bowel sounds are normal. He exhibits no distension. There is no tenderness.  Musculoskeletal:       Right knee: Normal.       Left knee: Normal.  Evaluation of his right hip, normal range of motion. No discomfort. Left hip, flexion about 90, no internal rotation, about 20 external rotation, 20 or 30 of abduction.  Neurological: He is alert and oriented to person, place, and time. He has normal strength and normal reflexes. No sensory deficit.  Skin: No rash noted. He is not diaphoretic. No erythema.  Psychiatric: He has a normal mood and affect. His behavior is normal.    Vital signs in last 24 hours: Temp:  [98.5 F (36.9 C)] 98.5 F (36.9 C) (12/15 0801) Pulse Rate:  [61] 61 (12/15 0801) Resp:  [18] 18 (12/15 0801) BP: (132)/(75) 132/75 mmHg (12/15 0801) SpO2:  [97 %] 97 % (12/15 0801) Weight:  [95.437 kg (210 lb 6.4 oz)] 95.437 kg (210 lb 6.4 oz) (12/15 0801)  Labs:   Estimated body mass index is 36.67 kg/(m^2) as calculated from the following:   Height as of 09/21/14: 5\' 7"  (1.702 m).   Weight as of 09/21/14: 106.232 kg (234 lb 3.2 oz).   Imaging Review Plain radiographs demonstrate severe degenerative joint disease of the left hip(s). The bone quality appears to be good for age and reported activity level.  Assessment/Plan:  End stage arthritis, left hip(s)  The patient history, physical examination, clinical judgement of the provider and imaging studies are consistent with end stage degenerative joint disease of  the left hip(s) and total hip arthroplasty is deemed medically necessary. The treatment options including medical management, injection therapy, arthroscopy and arthroplasty were discussed at length. The risks and benefits of total hip arthroplasty were presented and reviewed. The risks due to aseptic loosening, infection, stiffness, dislocation/subluxation,  thromboembolic complications and other imponderables were discussed.  The patient acknowledged the explanation, agreed to proceed with the plan and consent was signed. Patient is being admitted for inpatient treatment for surgery, pain control, PT, OT, prophylactic antibiotics, VTE prophylaxis, progressive ambulation and ADL's and discharge planning.The patient is planning to be discharged home with home health services    PCP: Dr. [name] Has  had issues with post-op voiding in the past Wants spinal    [employer], PA-C

## 2015-05-30 ENCOUNTER — Inpatient Hospital Stay (HOSPITAL_COMMUNITY)
Admission: RE | Admit: 2015-05-30 | Discharge: 2015-05-31 | DRG: 470 | Disposition: A | Discharge status: Home Health | Payer: No Typology Code available for payment source | Source: Ambulatory Visit | Attending: Orthopedic Surgery | Admitting: Orthopedic Surgery

## 2015-05-30 ENCOUNTER — Inpatient Hospital Stay (HOSPITAL_COMMUNITY): Payer: No Typology Code available for payment source | Admitting: Anesthesiology

## 2015-05-30 ENCOUNTER — Encounter (HOSPITAL_COMMUNITY): Payer: Self-pay | Admitting: *Deleted

## 2015-05-30 ENCOUNTER — Encounter (HOSPITAL_COMMUNITY)
Admission: RE | Disposition: A | Discharge status: Home Health | Payer: Self-pay | Source: Ambulatory Visit | Attending: Orthopedic Surgery

## 2015-05-30 ENCOUNTER — Inpatient Hospital Stay (HOSPITAL_COMMUNITY): Payer: No Typology Code available for payment source

## 2015-05-30 DIAGNOSIS — Z01812 Encounter for preprocedural laboratory examination: Secondary | ICD-10-CM | POA: Diagnosis not present

## 2015-05-30 DIAGNOSIS — M25552 Pain in left hip: Secondary | ICD-10-CM | POA: Diagnosis present

## 2015-05-30 DIAGNOSIS — Z9221 Personal history of antineoplastic chemotherapy: Secondary | ICD-10-CM

## 2015-05-30 DIAGNOSIS — M1612 Unilateral primary osteoarthritis, left hip: Principal | ICD-10-CM | POA: Diagnosis present

## 2015-05-30 DIAGNOSIS — M169 Osteoarthritis of hip, unspecified: Secondary | ICD-10-CM | POA: Diagnosis present

## 2015-05-30 DIAGNOSIS — Z87891 Personal history of nicotine dependence: Secondary | ICD-10-CM | POA: Diagnosis not present

## 2015-05-30 DIAGNOSIS — Z96649 Presence of unspecified artificial hip joint: Secondary | ICD-10-CM

## 2015-05-30 DIAGNOSIS — Z8572 Personal history of non-Hodgkin lymphomas: Secondary | ICD-10-CM | POA: Diagnosis not present

## 2015-05-30 HISTORY — PX: TOTAL HIP ARTHROPLASTY: SHX124

## 2015-05-30 LAB — TYPE AND SCREEN
ABO/RH(D): A POS
ANTIBODY SCREEN: NEGATIVE

## 2015-05-30 SURGERY — ARTHROPLASTY, HIP, TOTAL, ANTERIOR APPROACH
Anesthesia: Spinal | Site: Hip | Laterality: Left

## 2015-05-30 MED ORDER — ONDANSETRON HCL 4 MG PO TABS: 4.0000 mg | ORAL_TABLET | Freq: Four times a day (QID) | ORAL | Status: DC | PRN

## 2015-05-30 MED ORDER — OXYCODONE HCL 5 MG PO TABS
5.0000 mg | ORAL_TABLET | ORAL | Status: DC | PRN
  Administered 2015-05-30 – 2015-05-31 (×4): 10 mg via ORAL
  Filled 2015-05-30 (×4): qty 2

## 2015-05-30 MED ORDER — DEXAMETHASONE SODIUM PHOSPHATE 10 MG/ML IJ SOLN
10.0000 mg | Freq: Once | INTRAMUSCULAR | Status: AC
  Administered 2015-05-31: 10 mg via INTRAVENOUS
  Filled 2015-05-30: qty 1

## 2015-05-30 MED ORDER — FENTANYL CITRATE (PF) 250 MCG/5ML IJ SOLN
INTRAMUSCULAR | Status: DC | PRN
  Administered 2015-05-30 (×2): 50 ug via INTRAVENOUS

## 2015-05-30 MED ORDER — DIPHENHYDRAMINE HCL 12.5 MG/5ML PO ELIX
12.5000 mg | ORAL_SOLUTION | ORAL | Status: DC | PRN
  Administered 2015-05-30: 12.5 mg via ORAL
  Filled 2015-05-30: qty 5

## 2015-05-30 MED ORDER — MIDAZOLAM HCL 2 MG/2ML IJ SOLN
INTRAMUSCULAR | Status: AC
  Filled 2015-05-30: qty 2

## 2015-05-30 MED ORDER — ACETAMINOPHEN 650 MG RE SUPP: 650.0000 mg | Freq: Four times a day (QID) | RECTAL | Status: DC | PRN

## 2015-05-30 MED ORDER — MORPHINE SULFATE (PF) 2 MG/ML IV SOLN: 1.0000 mg | INTRAVENOUS | Status: DC | PRN

## 2015-05-30 MED ORDER — PROPOFOL 10 MG/ML IV BOLUS
INTRAVENOUS | Status: DC | PRN
  Administered 2015-05-30: 20 mg via INTRAVENOUS

## 2015-05-30 MED ORDER — DEXAMETHASONE SODIUM PHOSPHATE 10 MG/ML IJ SOLN
INTRAMUSCULAR | Status: AC
  Filled 2015-05-30: qty 1

## 2015-05-30 MED ORDER — HYDROMORPHONE HCL 1 MG/ML IJ SOLN: 0.5000 mg | INTRAMUSCULAR | Status: DC | PRN

## 2015-05-30 MED ORDER — BUPIVACAINE HCL (PF) 0.25 % IJ SOLN
INTRAMUSCULAR | Status: AC
  Filled 2015-05-30: qty 30

## 2015-05-30 MED ORDER — PHENOL 1.4 % MT LIQD: 1.0000 | OROMUCOSAL | Status: DC | PRN

## 2015-05-30 MED ORDER — RIVAROXABAN 10 MG PO TABS
10.0000 mg | ORAL_TABLET | Freq: Every day | ORAL | Status: DC
  Administered 2015-05-31: 10 mg via ORAL
  Filled 2015-05-30 (×2): qty 1

## 2015-05-30 MED ORDER — PROPOFOL 500 MG/50ML IV EMUL
INTRAVENOUS | Status: DC | PRN
  Administered 2015-05-30: 50 ug/kg/min via INTRAVENOUS

## 2015-05-30 MED ORDER — ATORVASTATIN CALCIUM 20 MG PO TABS
20.0000 mg | ORAL_TABLET | Freq: Every day | ORAL | Status: DC
  Administered 2015-05-31: 20 mg via ORAL
  Filled 2015-05-30: qty 1

## 2015-05-30 MED ORDER — ACETAMINOPHEN 500 MG PO TABS
1000.0000 mg | ORAL_TABLET | Freq: Four times a day (QID) | ORAL | Status: DC
Start: 2015-05-30 — End: 2015-05-31
  Administered 2015-05-30 – 2015-05-31 (×3): 1000 mg via ORAL
  Filled 2015-05-30 (×4): qty 2

## 2015-05-30 MED ORDER — SODIUM CHLORIDE 0.9 % IV SOLN: INTRAVENOUS | Status: DC

## 2015-05-30 MED ORDER — CEFAZOLIN SODIUM-DEXTROSE 2-3 GM-% IV SOLR
2.0000 g | Freq: Four times a day (QID) | INTRAVENOUS | Status: AC
  Administered 2015-05-30 – 2015-05-31 (×2): 2 g via INTRAVENOUS
  Filled 2015-05-30 (×2): qty 50

## 2015-05-30 MED ORDER — CEFAZOLIN SODIUM-DEXTROSE 2-3 GM-% IV SOLR
2.0000 g | INTRAVENOUS | Status: AC
  Administered 2015-05-30: 2 g via INTRAVENOUS

## 2015-05-30 MED ORDER — TRANEXAMIC ACID 1000 MG/10ML IV SOLN
1000.0000 mg | INTRAVENOUS | Status: AC
  Administered 2015-05-30: 1000 mg via INTRAVENOUS
  Filled 2015-05-30: qty 10

## 2015-05-30 MED ORDER — ONDANSETRON HCL 4 MG/2ML IJ SOLN
4.0000 mg | Freq: Four times a day (QID) | INTRAMUSCULAR | Status: DC | PRN
  Administered 2015-05-30 – 2015-05-31 (×2): 4 mg via INTRAVENOUS
  Filled 2015-05-30 (×2): qty 2

## 2015-05-30 MED ORDER — FENTANYL CITRATE (PF) 100 MCG/2ML IJ SOLN
INTRAMUSCULAR | Status: AC
  Filled 2015-05-30: qty 2

## 2015-05-30 MED ORDER — MIDAZOLAM HCL 5 MG/5ML IJ SOLN
INTRAMUSCULAR | Status: DC | PRN
  Administered 2015-05-30: 2 mg via INTRAVENOUS

## 2015-05-30 MED ORDER — ACETAMINOPHEN 325 MG PO TABS: 650.0000 mg | ORAL_TABLET | Freq: Four times a day (QID) | ORAL | Status: DC | PRN

## 2015-05-30 MED ORDER — ACETAMINOPHEN 10 MG/ML IV SOLN
1000.0000 mg | Freq: Once | INTRAVENOUS | Status: AC
  Administered 2015-05-30: 1000 mg via INTRAVENOUS
  Filled 2015-05-30: qty 100

## 2015-05-30 MED ORDER — BISACODYL 10 MG RE SUPP: 10.0000 mg | Freq: Every day | RECTAL | Status: DC | PRN

## 2015-05-30 MED ORDER — METHOCARBAMOL 500 MG PO TABS
500.0000 mg | ORAL_TABLET | Freq: Four times a day (QID) | ORAL | Status: DC | PRN
  Administered 2015-05-31: 500 mg via ORAL
  Filled 2015-05-30: qty 1

## 2015-05-30 MED ORDER — MENTHOL 3 MG MT LOZG: 1.0000 | LOZENGE | OROMUCOSAL | Status: DC | PRN

## 2015-05-30 MED ORDER — CEFAZOLIN SODIUM-DEXTROSE 2-3 GM-% IV SOLR
INTRAVENOUS | Status: AC
  Filled 2015-05-30: qty 50

## 2015-05-30 MED ORDER — DOCUSATE SODIUM 100 MG PO CAPS
100.0000 mg | ORAL_CAPSULE | Freq: Two times a day (BID) | ORAL | Status: DC
  Administered 2015-05-30 – 2015-05-31 (×2): 100 mg via ORAL

## 2015-05-30 MED ORDER — POLYETHYLENE GLYCOL 3350 17 G PO PACK: 17.0000 g | PACK | Freq: Every day | ORAL | Status: DC | PRN

## 2015-05-30 MED ORDER — METOCLOPRAMIDE HCL 5 MG/ML IJ SOLN: 5.0000 mg | Freq: Three times a day (TID) | INTRAMUSCULAR | Status: DC | PRN

## 2015-05-30 MED ORDER — METOCLOPRAMIDE HCL 10 MG PO TABS: 5.0000 mg | ORAL_TABLET | Freq: Three times a day (TID) | ORAL | Status: DC | PRN

## 2015-05-30 MED ORDER — BUPIVACAINE HCL (PF) 0.25 % IJ SOLN
INTRAMUSCULAR | Status: DC | PRN
  Administered 2015-05-30: 20 mL

## 2015-05-30 MED ORDER — LACTATED RINGERS IV SOLN
INTRAVENOUS | Status: DC
  Administered 2015-05-30: 1000 mL via INTRAVENOUS
  Administered 2015-05-30: 16:00:00 via INTRAVENOUS

## 2015-05-30 MED ORDER — ONDANSETRON HCL 4 MG/2ML IJ SOLN: 4.0000 mg | Freq: Once | INTRAMUSCULAR | Status: DC | PRN

## 2015-05-30 MED ORDER — MORPHINE SULFATE (PF) 2 MG/ML IV SOLN
INTRAVENOUS | Status: AC
  Administered 2015-05-30: 2 mg via INTRAVENOUS
  Filled 2015-05-30: qty 1

## 2015-05-30 MED ORDER — PROPOFOL 10 MG/ML IV BOLUS
INTRAVENOUS | Status: AC
  Filled 2015-05-30: qty 40

## 2015-05-30 MED ORDER — METHOCARBAMOL 1000 MG/10ML IJ SOLN
500.0000 mg | Freq: Four times a day (QID) | INTRAVENOUS | Status: DC | PRN
  Administered 2015-05-30: 500 mg via INTRAVENOUS
  Filled 2015-05-30 (×2): qty 5

## 2015-05-30 MED ORDER — SODIUM CHLORIDE 0.9 % IV SOLN
INTRAVENOUS | Status: DC
  Administered 2015-05-30: 18:00:00 via INTRAVENOUS

## 2015-05-30 MED ORDER — TRAMADOL HCL 50 MG PO TABS: 50.0000 mg | ORAL_TABLET | Freq: Four times a day (QID) | ORAL | Status: DC | PRN

## 2015-05-30 MED ORDER — DEXAMETHASONE SODIUM PHOSPHATE 10 MG/ML IJ SOLN
10.0000 mg | Freq: Once | INTRAMUSCULAR | Status: AC
  Administered 2015-05-30: 10 mg via INTRAVENOUS

## 2015-05-30 MED ORDER — ALPRAZOLAM 0.25 MG PO TABS: 0.2500 mg | ORAL_TABLET | Freq: Every evening | ORAL | Status: DC | PRN

## 2015-05-30 MED ORDER — LORATADINE 10 MG PO TABS
10.0000 mg | ORAL_TABLET | Freq: Every day | ORAL | Status: DC
  Administered 2015-05-31: 10 mg via ORAL
  Filled 2015-05-30: qty 1

## 2015-05-30 MED ORDER — PROPOFOL 10 MG/ML IV BOLUS
INTRAVENOUS | Status: AC
  Filled 2015-05-30: qty 20

## 2015-05-30 MED ORDER — FLEET ENEMA 7-19 GM/118ML RE ENEM: 1.0000 | ENEMA | Freq: Once | RECTAL | Status: DC | PRN

## 2015-05-30 MED ORDER — ACETAMINOPHEN 10 MG/ML IV SOLN
INTRAVENOUS | Status: AC
  Filled 2015-05-30: qty 100

## 2015-05-30 SURGICAL SUPPLY — 34 items
BAG DECANTER FOR FLEXI CONT (MISCELLANEOUS) ×1 IMPLANT
BAG SPEC THK2 15X12 ZIP CLS (MISCELLANEOUS)
BAG ZIPLOCK 12X15 (MISCELLANEOUS) IMPLANT
BLADE SAG 18X100X1.27 (BLADE) ×2 IMPLANT
CAPT HIP TOTAL 2 ×1 IMPLANT
CLOTH BEACON ORANGE TIMEOUT ST (SAFETY) ×2 IMPLANT
COVER PERINEAL POST (MISCELLANEOUS) ×2 IMPLANT
DECANTER SPIKE VIAL GLASS SM (MISCELLANEOUS) ×1 IMPLANT
DRAPE STERI IOBAN 125X83 (DRAPES) ×2 IMPLANT
DRAPE U-SHAPE 47X51 STRL (DRAPES) ×4 IMPLANT
DRSG ADAPTIC 3X8 NADH LF (GAUZE/BANDAGES/DRESSINGS) ×2 IMPLANT
DRSG MEPILEX BORDER 4X4 (GAUZE/BANDAGES/DRESSINGS) ×2 IMPLANT
DRSG MEPILEX BORDER 4X8 (GAUZE/BANDAGES/DRESSINGS) ×2 IMPLANT
DURAPREP 26ML APPLICATOR (WOUND CARE) ×2 IMPLANT
ELECT REM PT RETURN 9FT ADLT (ELECTROSURGICAL) ×2
ELECTRODE REM PT RTRN 9FT ADLT (ELECTROSURGICAL) ×1 IMPLANT
EVACUATOR 1/8 PVC DRAIN (DRAIN) ×2 IMPLANT
GLOVE BIO SURGEON STRL SZ7.5 (GLOVE) ×2 IMPLANT
GLOVE BIO SURGEON STRL SZ8 (GLOVE) ×3 IMPLANT
GLOVE BIOGEL PI IND STRL 8 (GLOVE) ×2 IMPLANT
GLOVE BIOGEL PI INDICATOR 8 (GLOVE) ×2
GOWN STRL REUS W/TWL LRG LVL3 (GOWN DISPOSABLE) ×2 IMPLANT
GOWN STRL REUS W/TWL XL LVL3 (GOWN DISPOSABLE) ×2 IMPLANT
PACK ANTERIOR HIP CUSTOM (KITS) ×2 IMPLANT
STRIP CLOSURE SKIN 1/2X4 (GAUZE/BANDAGES/DRESSINGS) ×2 IMPLANT
SUT ETHIBOND NAB CT1 #1 30IN (SUTURE) ×2 IMPLANT
SUT MNCRL AB 4-0 PS2 18 (SUTURE) ×2 IMPLANT
SUT VIC AB 2-0 CT1 27 (SUTURE) ×4
SUT VIC AB 2-0 CT1 TAPERPNT 27 (SUTURE) ×2 IMPLANT
SUT VLOC 180 0 24IN GS25 (SUTURE) ×2 IMPLANT
SYR 50ML LL SCALE MARK (SYRINGE) IMPLANT
TRAY FOLEY W/METER SILVER 14FR (SET/KITS/TRAYS/PACK) ×1 IMPLANT
TRAY FOLEY W/METER SILVER 16FR (SET/KITS/TRAYS/PACK) ×2 IMPLANT
YANKAUER SUCT BULB TIP 10FT TU (MISCELLANEOUS) ×2 IMPLANT

## 2015-05-30 NOTE — Transfer of Care (Signed)
Immediate Anesthesia Transfer of Care Note  Patient: Ian Park  Procedure(s) Performed: Procedure(s): LEFT TOTAL HIP ARTHROPLASTY ANTERIOR APPROACH (Left)  Patient Location: PACU  Anesthesia Type:MAC and Spinal  Level of Consciousness: awake, alert  and patient cooperative  Airway & Oxygen Therapy: Patient Spontanous Breathing and Patient connected to face mask oxygen  Post-op Assessment: Report given to RN and Post -op Vital signs reviewed and stable  Post vital signs: Reviewed and stable  Last Vitals:  Filed Vitals:   05/30/15 1123  BP: 129/77  Pulse: 71  Temp: 36.8 C  Resp: 18    Complications: No apparent anesthesia complications

## 2015-05-30 NOTE — Anesthesia Preprocedure Evaluation (Signed)
Anesthesia Evaluation  Patient identified by MRN, date of birth, ID band Patient awake    Reviewed: Allergy & Precautions, NPO status , Patient's Chart, lab work & pertinent test results  Airway Mallampati: I       Dental   Pulmonary former smoker,    Pulmonary exam normal        Cardiovascular Normal cardiovascular exam Rhythm:Regular Rate:Normal     Neuro/Psych  Headaches,    GI/Hepatic   Endo/Other    Renal/GU Renal disease     Musculoskeletal  (+) Arthritis ,   Abdominal   Peds  Hematology   Anesthesia Other Findings   Reproductive/Obstetrics                             Anesthesia Physical Anesthesia Plan  ASA: II  Anesthesia Plan: Spinal   Post-op Pain Management:    Induction:   Airway Management Planned:   Additional Equipment:   Intra-op Plan:   Post-operative Plan:   Informed Consent: I have reviewed the patients History and Physical, chart, labs and discussed the procedure including the risks, benefits and alternatives for the proposed anesthesia with the patient or authorized representative who has indicated his/her understanding and acceptance.     Plan Discussed with: CRNA, Anesthesiologist and Surgeon  Anesthesia Plan Comments:         Anesthesia Quick Evaluation

## 2015-05-30 NOTE — Interval H&P Note (Signed)
History and Physical Interval Note:  05/30/2015 2:39 PM  Ian Park  has presented today for surgery, with the diagnosis of left hip osteoarthritis  The various methods of treatment have been discussed with the patient and family. After consideration of risks, benefits and other options for treatment, the patient has consented to  Procedure(s): LEFT TOTAL HIP ARTHROPLASTY ANTERIOR APPROACH (Left) as a surgical intervention .  The patient's history has been reviewed, patient examined, no change in status, stable for surgery.  I have reviewed the patient's chart and labs.  Questions were answered to the patient's satisfaction.     [name]

## 2015-05-30 NOTE — Anesthesia Postprocedure Evaluation (Signed)
Anesthesia Post Note  Patient: Ian Park  Procedure(s) Performed: Procedure(s) (LRB): LEFT TOTAL HIP ARTHROPLASTY ANTERIOR APPROACH (Left)  Patient location during evaluation: PACU Anesthesia Type: Spinal Level of consciousness: awake, awake and alert, oriented and patient cooperative Pain management: pain level controlled Vital Signs Assessment: post-procedure vital signs reviewed and stable Respiratory status: spontaneous breathing Cardiovascular status: blood pressure returned to baseline and stable Postop Assessment: no signs of nausea or vomiting and spinal receding Anesthetic complications: no    Last Vitals:  Filed Vitals:   05/30/15 1123 05/30/15 1635  BP: 129/77 115/70  Pulse: 71 64  Temp: 36.8 C 36.3 C  Resp: 18 19    Last Pain: There were no vitals filed for this visit.               Destiny Hagin EDWARD

## 2015-05-30 NOTE — Op Note (Signed)
OPERATIVE REPORT  PREOPERATIVE DIAGNOSIS: Osteoarthritis of the Left hip.   POSTOPERATIVE DIAGNOSIS: Osteoarthritis of the Left  hip.   PROCEDURE: Left total hip arthroplasty, anterior approach.   SURGEON: Gaynelle Arabian, MD   ASSISTANT: Arlee Muslim, PA-C  ANESTHESIA:  Spinal  ESTIMATED BLOOD LOSS:-400 ml   DRAINS: Hemovac x1.   COMPLICATIONS: None   CONDITION: PACU - hemodynamically stable.   BRIEF CLINICAL NOTE: Ian Park is a 64 y.o. male who has advanced end-  stage arthritis of his Left  hip with progressively worsening pain and  dysfunction.The patient has failed nonoperative management and presents for  total hip arthroplasty.   PROCEDURE IN DETAIL: After successful administration of spinal  anesthetic, the traction boots for the [facility] bed were placed on both  feet and the patient was placed onto the [facility] bed, boots placed into the leg  holders. The Left hip was then isolated from the perineum with plastic  drapes and prepped and draped in the usual sterile fashion. ASIS and  greater trochanter were marked and a oblique incision was made, starting  at about 1 cm lateral and 2 cm distal to the ASIS and coursing towards  the anterior cortex of the femur. The skin was cut with a 10 blade  through subcutaneous tissue to the level of the fascia overlying the  tensor fascia lata muscle. The fascia was then incised in line with the  incision at the junction of the anterior third and posterior 2/3rd. The  muscle was teased off the fascia and then the interval between the TFL  and the rectus was developed. The Hohmann retractor was then placed at  the top of the femoral neck over the capsule. The vessels overlying the  capsule were cauterized and the fat on top of the capsule was removed.  A Hohmann retractor was then placed anterior underneath the rectus  femoris to give exposure to the entire anterior capsule. A T-shaped  capsulotomy was performed. The edges  were tagged and the femoral head  was identified.       Osteophytes are removed off the superior acetabulum.  The femoral neck was then cut in situ with an oscillating saw. Traction  was then applied to the left lower extremity utilizing the [facility]  traction. The femoral head was then removed. Retractors were placed  around the acetabulum and then circumferential removal of the labrum was  performed. Osteophytes were also removed. Reaming starts at 47 mm to  medialize and  Increased in 2 mm increments to 53 mm. We reamed in  approximately 40 degrees of abduction, 20 degrees anteversion. A 54 mm  pinnacle acetabular shell was then impacted in anatomic position under  fluoroscopic guidance with excellent purchase. We did not need to place  any additional dome screws. A 36 mm neutral + 4 marathon liner was then  placed into the acetabular shell.       The femoral lift was then placed along the lateral aspect of the femur  just distal to the vastus ridge. The leg was  externally rotated and capsule  was stripped off the inferior aspect of the femoral neck down to the  level of the lesser trochanter, this was done with electrocautery. The femur was lifted after this was performed. The  leg was then placed and extended in adducted position to essentially delivering the femur. We also removed the capsule superiorly and the  piriformis from the piriformis  fossa to gain excellent exposure of the  proximal femur. Rongeur was used to remove some cancellous bone to get  into the lateral portion of the proximal femur for placement of the  initial starter reamer. The starter broaches was placed  the starter broach  and was shown to go down the center of the canal. Broaching  with the  Corail system was then performed starting at size 8, coursing  Up to size 14. A size 14 had excellent torsional and rotational  and axial stability. The trial standard offset neck was then placed  with a 36 + 1.5 trial head.  The hip was then reduced. We confirmed that  the stem was in the canal both on AP and lateral x-rays. It also has excellent sizing. The hip was reduced with outstanding stability through full extension, full external rotation,  and then flexion in adduction internal rotation. AP pelvis was taken  and the leg lengths were measured and found to be exactly equal. Hip  was then dislocated again and the femoral head and neck removed. The  femoral broach was removed. Size 14 Corail stem with a standard offset  neck was then impacted into the femur following native anteversion. Has  excellent purchase in the canal. Excellent torsional and rotational and  axial stability. It is confirmed to be in the canal on AP and lateral  fluoroscopic views. The 36 + 1.5 ceramic head was placed and the hip  reduced with outstanding stability. Again AP pelvis was taken and it  confirmed that the leg lengths were equal. The wound was then copiously  irrigated with saline solution and the capsule reattached and repaired  with Ethibond suture. 30 ml of .25% Bupivicaine injected into the capsule and into the edge of the tensor fascia lata as well as subcutaneous tissue. The fascia overlying the tensor fascia lata was  then closed with a running #1 V-Loc. Subcu was closed with interrupted  2-0 Vicryl and subcuticular running 4-0 Monocryl. Incision was cleaned  and dried. Steri-Strips and a bulky sterile dressing applied. Hemovac  drain was hooked to suction and then he was awakened and transported to  recovery in stable condition.        Please note that a surgical assistant was a medical necessity for this procedure to perform it in a safe and expeditious manner. Assistant was necessary to provide appropriate retraction of vital neurovascular structures and to prevent femoral fracture and allow for anatomic placement of the prosthesis.  Gaynelle Arabian, M.D.

## 2015-05-30 NOTE — Anesthesia Procedure Notes (Signed)
Spinal Patient location during procedure: OR Start time: 05/30/2015 2:55 PM End time: 05/30/2015 2:59 PM Staffing Anesthesiologist: Kate Sable Performed by: anesthesiologist  Preanesthetic Checklist Completed: patient identified, site marked, surgical consent, pre-op evaluation, timeout performed, IV checked, risks and benefits discussed and monitors and equipment checked Spinal Block Patient position: left lateral decubitus Prep: Betadine Patient monitoring: heart rate, cardiac monitor, continuous pulse ox and blood pressure Approach: midline Location: L3-4 Injection technique: single-shot Needle Needle gauge: 22 G Needle length: 9 cm Needle insertion depth: 6 cm Assessment Sensory level: T10 Additional Notes Pt accepts risks and procedure. 22ga w/ mild difficulty x2 CSF clear freeflow. 12mg  Marcaine 0.75% w/ epi w/o difficulty. Pt tolerated well. GES

## 2015-05-31 ENCOUNTER — Encounter (HOSPITAL_COMMUNITY): Payer: Self-pay | Admitting: Orthopedic Surgery

## 2015-05-31 LAB — CBC
HEMATOCRIT: 37.1 % — AB (ref 39.0–52.0)
Hemoglobin: 12.3 g/dL — ABNORMAL LOW (ref 13.0–17.0)
MCH: 30.8 pg (ref 26.0–34.0)
MCHC: 33.2 g/dL (ref 30.0–36.0)
MCV: 92.8 fL (ref 78.0–100.0)
PLATELETS: 219 10*3/uL (ref 150–400)
RBC: 4 MIL/uL — ABNORMAL LOW (ref 4.22–5.81)
RDW: 13 % (ref 11.5–15.5)
WBC: 10.7 10*3/uL — AB (ref 4.0–10.5)

## 2015-05-31 LAB — BASIC METABOLIC PANEL
ANION GAP: 6 (ref 5–15)
BUN: 16 mg/dL (ref 6–20)
CALCIUM: 8.7 mg/dL — AB (ref 8.9–10.3)
CO2: 26 mmol/L (ref 22–32)
CREATININE: 0.95 mg/dL (ref 0.61–1.24)
Chloride: 102 mmol/L (ref 101–111)
GLUCOSE: 134 mg/dL — AB (ref 65–99)
Potassium: 4.6 mmol/L (ref 3.5–5.1)
Sodium: 134 mmol/L — ABNORMAL LOW (ref 135–145)

## 2015-05-31 MED ORDER — HYDROMORPHONE HCL 2 MG PO TABS
2.0000 mg | ORAL_TABLET | ORAL | Status: DC | PRN
Start: 2015-05-31 — End: 2015-05-31
  Administered 2015-05-31 (×2): 2 mg via ORAL
  Filled 2015-05-31: qty 1
  Filled 2015-05-31: qty 2
  Filled 2015-05-31: qty 1

## 2015-05-31 MED ORDER — RIVAROXABAN 10 MG PO TABS: 10.0000 mg | ORAL_TABLET | Freq: Every day | ORAL | Status: DC

## 2015-05-31 MED ORDER — HYDROMORPHONE HCL 2 MG PO TABS: 2.0000 mg | ORAL_TABLET | ORAL | Status: DC | PRN

## 2015-05-31 MED ORDER — CYCLOBENZAPRINE HCL 5 MG PO TABS: 5.0000 mg | ORAL_TABLET | Freq: Three times a day (TID) | ORAL | Status: DC | PRN

## 2015-05-31 MED ORDER — TRAMADOL HCL 50 MG PO TABS: 50.0000 mg | ORAL_TABLET | Freq: Four times a day (QID) | ORAL | Status: DC | PRN

## 2015-05-31 MED ORDER — ALUM & MAG HYDROXIDE-SIMETH 200-200-20 MG/5ML PO SUSP: 30.0000 mL | Freq: Four times a day (QID) | ORAL | Status: DC | PRN

## 2015-05-31 NOTE — Progress Notes (Signed)
Patient is alert and oriented. VS stable. No s/s of acute distress. Patient being discharged to home with [facility]. Reviewed discharge education, information and instructions. Patient states understanding and has no questions at this time.

## 2015-05-31 NOTE — Discharge Summary (Signed)
Physician Discharge Summary   Patient ID: Ian Park MRN: [id number] DOB/AGE: 1950/09/18 64 y.o.  Admit date: 05/30/2015 Discharge date: 05-31-2015  Primary Diagnosis:  Osteoarthritis of the Left hip.  Admission Diagnoses:  Past Medical History  Diagnosis Date  . 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 Cancer ([place])     lymphoma  . Hypercholesterolemia   . Kidney stone   . History of chemotherapy 2011  . Headache   . Arthritis     oa, hips and right shoulder  . Complication of anesthesia     "slow to wake up and PEE"  . Birthmark     right leg portwine stain birthmark  . Shin injury     red area no drainage pt says present for last 30 years   Discharge Diagnoses:   Principal Problem:   OA (osteoarthritis) of hip  Estimated body mass index is 32.94 kg/(m^2) as calculated from the following:   Height as of this encounter: 5' 7"  (1.702 m).   Weight as of this encounter: 95.425 kg (210 lb 6 oz).  Procedure(s) (LRB): LEFT TOTAL HIP ARTHROPLASTY ANTERIOR APPROACH (Left)   Consults: None  HPI: Ian Park is a 64 y.o. male who has advanced end-  stage arthritis of his Left hip with progressively worsening pain and  dysfunction.The patient has failed nonoperative management and presents for  total hip arthroplasty.   Laboratory Data: Admission on 05/30/2015  Component Date Value Ref Range Status  . WBC 05/31/2015 10.7* 4.0 - 10.5 K/uL Final  . RBC 05/31/2015 4.00* 4.22 - 5.81 MIL/uL Final  . Hemoglobin 05/31/2015 12.3* 13.0 - 17.0 g/dL Final  . HCT 05/31/2015 37.1* 39.0 - 52.0 % Final  . MCV 05/31/2015 92.8  78.0 - 100.0 fL Final  . MCH 05/31/2015 30.8  26.0 - 34.0 pg Final  . MCHC 05/31/2015 33.2  30.0 - 36.0 g/dL Final  . RDW 05/31/2015 13.0  11.5 - 15.5 % Final  . Platelets 05/31/2015 219  150 - 400 K/uL Final  . Sodium 05/31/2015 134* 135 - 145 mmol/L Final  . Potassium 05/31/2015 4.6  3.5 - 5.1 mmol/L Final  . Chloride 05/31/2015 102   101 - 111 mmol/L Final  . CO2 05/31/2015 26  22 - 32 mmol/L Final  . Glucose, Bld 05/31/2015 134* 65 - 99 mg/dL Final  . BUN 05/31/2015 16  6 - 20 mg/dL Final  . Creatinine, Ser 05/31/2015 0.95  0.61 - 1.24 mg/dL Final  . Calcium 05/31/2015 8.7* 8.9 - 10.3 mg/dL Final  . GFR calc non Af Amer 05/31/2015 >60  >60 mL/min Final  . GFR calc Af Amer 05/31/2015 >60  >60 mL/min Final   Comment: (NOTE) The eGFR has been calculated using the CKD EPI equation. This calculation has not been validated in all clinical situations. eGFR's persistently <60 mL/min signify possible Chronic Kidney Disease.   Georgiann Hahn gap 05/31/2015 6  5 - 15 Final  Hospital Outpatient Visit on 05/26/2015  Component Date Value Ref Range Status  . MRSA, PCR 05/26/2015 NEGATIVE  NEGATIVE Final  . Staphylococcus aureus 05/26/2015 NEGATIVE  NEGATIVE Final   Comment:        The Xpert SA Assay (FDA approved for NASAL specimens in patients over 38 years of age), is one component of a comprehensive surveillance program.  Test performance has been validated by [facility] for patients greater than or equal to 62 year old. It is not intended to  diagnose infection nor to guide or monitor treatment.   [name] aPTT 05/26/2015 31  24 - 37 seconds Final  . WBC 05/26/2015 4.7  4.0 - 10.5 K/uL Final  . RBC 05/26/2015 4.65  4.22 - 5.81 MIL/uL Final  . Hemoglobin 05/26/2015 14.4  13.0 - 17.0 g/dL Final  . HCT 05/26/2015 44.3  39.0 - 52.0 % Final  . MCV 05/26/2015 95.3  78.0 - 100.0 fL Final  . MCH 05/26/2015 31.0  26.0 - 34.0 pg Final  . MCHC 05/26/2015 32.5  30.0 - 36.0 g/dL Final  . RDW 05/26/2015 13.6  11.5 - 15.5 % Final  . Platelets 05/26/2015 269  150 - 400 K/uL Final  . Sodium 05/26/2015 142  135 - 145 mmol/L Final  . Potassium 05/26/2015 4.6  3.5 - 5.1 mmol/L Final  . Chloride 05/26/2015 107  101 - 111 mmol/L Final  . CO2 05/26/2015 27  22 - 32 mmol/L Final  . Glucose, Bld 05/26/2015 121* 65 - 99 mg/dL Final  . BUN  05/26/2015 19  6 - 20 mg/dL Final  . Creatinine, Ser 05/26/2015 0.98  0.61 - 1.24 mg/dL Final  . Calcium 05/26/2015 9.2  8.9 - 10.3 mg/dL Final  . Total Protein 05/26/2015 6.9  6.5 - 8.1 g/dL Final  . Albumin 05/26/2015 4.3  3.5 - 5.0 g/dL Final  . AST 05/26/2015 22  15 - 41 U/L Final  . ALT 05/26/2015 21  17 - 63 U/L Final  . Alkaline Phosphatase 05/26/2015 64  38 - 126 U/L Final  . Total Bilirubin 05/26/2015 0.7  0.3 - 1.2 mg/dL Final  . GFR calc non Af Amer 05/26/2015 >60  >60 mL/min Final  . GFR calc Af Amer 05/26/2015 >60  >60 mL/min Final   Comment: (NOTE) The eGFR has been calculated using the CKD EPI equation. This calculation has not been validated in all clinical situations. eGFR's persistently <60 mL/min signify possible Chronic Kidney Disease.   . Anion gap 05/26/2015 8  5 - 15 Final  . Prothrombin Time 05/26/2015 13.6  11.6 - 15.2 seconds Final  . INR 05/26/2015 1.02  0.00 - 1.49 Final  . ABO/RH(D) 05/26/2015 A POS   Final  . Antibody Screen 05/26/2015 NEG   Final  . Sample Expiration 05/26/2015 06/02/2015   Final  . Extend sample reason 05/26/2015 NO TRANSFUSIONS OR PREGNANCY IN THE PAST 3 MONTHS   Final  . Color, Urine 05/26/2015 YELLOW  YELLOW Final  . APPearance 05/26/2015 CLEAR  CLEAR Final  . Specific Gravity, Urine 05/26/2015 1.023  1.005 - 1.030 Final  . pH 05/26/2015 5.5  5.0 - 8.0 Final  . Glucose, UA 05/26/2015 NEGATIVE  NEGATIVE mg/dL Final  . Hgb urine dipstick 05/26/2015 MODERATE* NEGATIVE Final  . Bilirubin Urine 05/26/2015 NEGATIVE  NEGATIVE Final  . Ketones, ur 05/26/2015 NEGATIVE  NEGATIVE mg/dL Final  . Protein, ur 05/26/2015 NEGATIVE  NEGATIVE mg/dL Final  . Nitrite 05/26/2015 NEGATIVE  NEGATIVE Final  . Leukocytes, UA 05/26/2015 NEGATIVE  NEGATIVE Final  . ABO/RH(D) 05/26/2015 A POS   Final  . Squamous Epithelial / LPF 05/26/2015 0-5* NONE SEEN Final  . WBC, UA 05/26/2015 6-30  0 - 5 WBC/hpf Final  . RBC / HPF 05/26/2015 6-30  0 - 5 RBC/hpf  Final  . Bacteria, UA 05/26/2015 RARE* NONE SEEN Final  . Crystals 05/26/2015 CA OXALATE CRYSTALS* NEGATIVE Final  . Urine-Other 05/26/2015 MUCOUS PRESENT   Final     X-Rays:Dg Pelvis Portable  05/30/2015  CLINICAL DATA:  Immediate postop anterior approach left total hip arthroplasty. EXAM: PORTABLE PELVIS 1-2 VIEWS COMPARISON:  Bone window images from CT abdomen and pelvis 12/01/2012. FINDINGS: Anatomic alignment in the AP projection post left total hip arthroplasty. No acute complicating features. Surgical drain in place. Note is made of a bladder calculus in the lower pelvis which has increased in size since the prior CT. IMPRESSION: 1. Anatomic alignment post left total hip arthroplasty without acute complicating features. 2. Incidental note of a bladder calculus which has increased in size since a prior CT from June, 2014. Electronically Signed   By: Evangeline Dakin M.D.   On: 05/30/2015 17:18   Dg C-arm 1-60 Min-no Report  05/30/2015  CLINICAL DATA: surgery C-ARM 1-60 MINUTES Fluoroscopy was utilized by the requesting physician.  No radiographic interpretation.    EKG:No orders found for this or any previous visit.   Hospital Course: Patient was admitted to [facility] and taken to the OR and underwent the above state procedure without complications.  Patient tolerated the procedure well and was later transferred to the recovery room and then to the orthopaedic floor for postoperative care.  They were given PO and IV analgesics for pain control following their surgery.  They were given 24 hours of postoperative antibiotics of  Anti-infectives    Start     Dose/Rate Route Frequency Ordered Stop   05/30/15 2200  ceFAZolin (ANCEF) IVPB 2 g/50 mL premix     2 g 100 mL/hr over 30 Minutes Intravenous Every 6 hours 05/30/15 1746 05/31/15 0446   05/30/15 1126  ceFAZolin (ANCEF) IVPB 2 g/50 mL premix     2 g 100 mL/hr over 30 Minutes Intravenous On call to O.R. 05/30/15 1126 05/30/15  1452     and started on DVT prophylaxis in the form of Xarelto.   PT and OT were ordered for total hip protocol.  The patient was allowed to be WBAT with therapy. Discharge planning was consulted to help with postop disposition and equipment needs.  Patient had a good night on the evening of surgery.  They started to get up OOB with therapy on day one.  Hemovac drain was pulled without difficulty.  Patient was seen in rounds and was ready to go home.  Discharge home with home health if meets goals Diet - Cardiac diet Follow up - in 2 weeks Activity - WBAT Disposition - Home Condition Upon Discharge - good D/C Meds - See DC Summary DVT Prophylaxis - Xarelto   Discharge Instructions    Call MD / Call 911    Complete by:  As directed   If you experience chest pain or shortness of breath, CALL 911 and be transported to the hospital emergency room.  If you develope a fever above 101 F, pus (white drainage) or increased drainage or redness at the wound, or calf pain, call your surgeon's office.     Change dressing    Complete by:  As directed   You may change your dressing dressing daily with sterile 4 x 4 inch gauze dressing and paper tape.  Do not submerge the incision under water.     Constipation Prevention    Complete by:  As directed   Drink plenty of fluids.  Prune juice may be helpful.  You may use a stool softener, such as Colace (over the counter) 100 mg twice a day.  Use MiraLax (over the counter) for constipation as needed.  Diet - low sodium heart healthy    Complete by:  As directed      Discharge instructions    Complete by:  As directed   Pick up stool softner and laxative for home use following surgery while on pain medications. Do not submerge incision under water. May remove the surgical dressing tomorrow, Wednesday 06/01/2015, and then apply a dry gauze dressing daily. Please use good hand washing techniques while changing dressing each day. May shower starting three  days after surgery starting Thursday 06/02/2015. Please use a clean towel to pat the incision dry following showers. Continue to use ice for pain and swelling after surgery. Do not use any lotions or creams on the incision until instructed by your surgeon.  Total Hip protocol.  Postoperative Constipation Protocol  Constipation - defined medically as fewer than three stools per week and severe constipation as less than one stool per week.  One of the most common issues patients have following surgery is constipation. Even if you have a regular bowel pattern at home, your normal regimen is likely to be disrupted due to multiple reasons following surgery. Combination of anesthesia, postoperative narcotics, change in appetite and fluid intake all can affect your bowels. In order to avoid complications following surgery, here are some recommendations in order to help you during your recovery period.  Colace (docusate) - Pick up an over-the-counter form of Colace or another stool softener and take twice a day as long as you are requiring postoperative pain medications. Take with a full glass of water daily. If you experience loose stools or diarrhea, hold the colace until you stool forms back up. If your symptoms do not get better within 1 week or if they get worse, check with your doctor.  Dulcolax (bisacodyl) - Pick up over-the-counter and take as directed by the product packaging as needed to assist with the movement of your bowels. Take with a full glass of water. Use this product as needed if not relieved by Colace only.   MiraLax (polyethylene glycol) - Pick up over-the-counter to have on hand. MiraLax is a solution that will increase the amount of water in your bowels to assist with bowel movements. Take as directed and can mix with a glass of water, juice, soda, coffee, or tea. Take if you go more than two days without a movement. Do not use MiraLax more than once per day. Call your  doctor if you are still constipated or irregular after using this medication for 7 days in a row.  If you continue to have problems with postoperative constipation, please contact the office for further assistance and recommendations. If you experience "the worst abdominal pain ever" or develop nausea or vomiting, please contact the office immediatly for further recommendations for treatment.  Take Xarelto for two and a half more weeks, then discontinue Xarelto. Once the patient has completed the Xarelto, they may resume the 81 mg Aspirin.     Do not sit on low chairs, stoools or toilet seats, as it may be difficult to get up from low surfaces    Complete by:  As directed      Driving restrictions    Complete by:  As directed   No driving until released by the physician.     Increase activity slowly as tolerated    Complete by:  As directed      Lifting restrictions    Complete by:  As directed   No lifting until released by the  physician.     Patient may shower    Complete by:  As directed   You may shower without a dressing once there is no drainage.  Do not wash over the wound.  If drainage remains, do not shower until drainage stops.     TED hose    Complete by:  As directed   Use stockings (TED hose) for 3 weeks on both leg(s).  You may remove them at night for sleeping.     Weight bearing as tolerated    Complete by:  As directed   Laterality:  left  Extremity:  Lower            Medication List    STOP taking these medications        aspirin EC 81 MG tablet     BENADRYL PO     HYDROcodone-acetaminophen 5-325 MG tablet  Commonly known as:  NORCO/VICODIN     meloxicam 15 MG tablet  Commonly known as:  MOBIC     multivitamin tablet     SAW PALMETTO PO      TAKE these medications        ALPRAZolam 0.25 MG tablet  Commonly known as:  XANAX  Take 0.25 mg by mouth at bedtime as needed for sleep.     atorvastatin 20 MG tablet  Commonly known as:  LIPITOR  Take  20 mg by mouth daily.     cetirizine 10 MG tablet  Commonly known as:  ZYRTEC  Take 10 mg by mouth daily.     cyclobenzaprine 5 MG tablet  Commonly known as:  FLEXERIL  Take 1 tablet (5 mg total) by mouth 3 (three) times daily as needed for muscle spasms.     HYDROmorphone 2 MG tablet  Commonly known as:  DILAUDID  Take 1-2 tablets (2-4 mg total) by mouth every 3 (three) hours as needed for moderate pain or severe pain.     rivaroxaban 10 MG Tabs tablet  Commonly known as:  XARELTO  Take 1 tablet (10 mg total) by mouth daily with breakfast. Take Xarelto for two and a half more weeks, then discontinue Xarelto. Once the patient has completed the Xarelto, they may resume the 81 mg Aspirin.     traMADol 50 MG tablet  Commonly known as:  ULTRAM  Take 1-2 tablets (50-100 mg total) by mouth every 6 (six) hours as needed (mild pain).           Follow-up Information    Follow up with [name], MD. Schedule an appointment as soon as possible for a visit on 06/16/2015.   Specialty:  Orthopedic Surgery   Why:  Call office at [phone] to setup appointment in two weeks on Thursday 06/16/2015 with Dr. Wynelle Link.   Contact information:   [address] [place] [address] [phone]       Signed: Arlee Muslim, PA-C Orthopaedic Surgery 05/31/2015, 9:08 AM

## 2015-05-31 NOTE — Discharge Instructions (Addendum)
° °Dr. Frank Aluisio °Total Joint Specialist °[facility] °[address] °[place], [place] [address] °[phone] ° °ANTERIOR APPROACH TOTAL HIP REPLACEMENT POSTOPERATIVE DIRECTIONS ° ° °Hip Rehabilitation, Guidelines Following Surgery  °The results of a hip operation are greatly improved after range of motion and muscle strengthening exercises. Follow all safety measures which are given to protect your hip. If any of these exercises cause increased pain or swelling in your joint, decrease the amount until you are comfortable again. Then slowly increase the exercises. Call your caregiver if you have problems or questions.  ° °HOME CARE INSTRUCTIONS  °Remove items at home which could result in a fall. This includes throw rugs or furniture in walking pathways.  °· ICE to the affected hip every three hours for 30 minutes at a time and then as needed for pain and swelling.  Continue to use ice on the hip for pain and swelling from surgery. You may notice swelling that will progress down to the foot and ankle.  This is normal after surgery.  Elevate the leg when you are not up walking on it.   °· Continue to use the breathing machine which will help keep your temperature down.  It is common for your temperature to cycle up and down following surgery, especially at night when you are not up moving around and exerting yourself.  The breathing machine keeps your lungs expanded and your temperature down. ° ° °DIET °You may resume your previous home diet once your are discharged from the hospital. ° °DRESSING / WOUND CARE / SHOWERING °You may shower 3 days after surgery, but keep the wounds dry during showering.  You may use an occlusive plastic wrap (Press'n Seal for example), NO SOAKING/SUBMERGING IN THE BATHTUB.  If the bandage gets wet, change with a clean dry gauze.  If the incision gets wet, pat the wound dry with a clean towel. °You may start showering once you are discharged home but do not  submerge the incision under water. Just pat the incision dry and apply a dry gauze dressing on daily. °Change the surgical dressing daily and reapply a dry dressing each time. ° °ACTIVITY °Walk with your walker as instructed. °Use walker as long as suggested by your caregivers. °Avoid periods of inactivity such as sitting longer than an hour when not asleep. This helps prevent blood clots.  °You may resume a sexual relationship in one month or when given the OK by your doctor.  °You may return to work once you are cleared by your doctor.  °Do not drive a car for 6 weeks or until released by you surgeon.  °Do not drive while taking narcotics. ° °WEIGHT BEARING °Weight bearing as tolerated with assist device (walker, cane, etc) as directed, use it as long as suggested by your surgeon or therapist, typically at least 4-6 weeks. ° °POSTOPERATIVE CONSTIPATION PROTOCOL °Constipation - defined medically as fewer than three stools per week and severe constipation as less than one stool per week. ° °One of the most common issues patients have following surgery is constipation.  Even if you have a regular bowel pattern at home, your normal regimen is likely to be disrupted due to multiple reasons following surgery.  Combination of anesthesia, postoperative narcotics, change in appetite and fluid intake all can affect your bowels.  In order to avoid complications following surgery, here are some recommendations in order to help you during your recovery period. ° °Colace (docusate) - Pick up an over-the-counter   form of Colace or another stool softener and take twice a day as long as you are requiring postoperative pain medications.  Take with a full glass of water daily.  If you experience loose stools or diarrhea, hold the colace until you stool forms back up.  If your symptoms do not get better within 1 week or if they get worse, check with your doctor. ° °Dulcolax (bisacodyl) - Pick up over-the-counter and take as directed  by the product packaging as needed to assist with the movement of your bowels.  Take with a full glass of water.  Use this product as needed if not relieved by Colace only.  ° °MiraLax (polyethylene glycol) - Pick up over-the-counter to have on hand.  MiraLax is a solution that will increase the amount of water in your bowels to assist with bowel movements.  Take as directed and can mix with a glass of water, juice, soda, coffee, or tea.  Take if you go more than two days without a movement. °Do not use MiraLax more than once per day. Call your doctor if you are still constipated or irregular after using this medication for 7 days in a row. ° °If you continue to have problems with postoperative constipation, please contact the office for further assistance and recommendations.  If you experience "the worst abdominal pain ever" or develop nausea or vomiting, please contact the office immediatly for further recommendations for treatment. ° °ITCHING ° If you experience itching with your medications, try taking only a single pain pill, or even half a pain pill at a time.  You can also use Benadryl over the counter for itching or also to help with sleep.  ° °TED HOSE STOCKINGS °Wear the elastic stockings on both legs for three weeks following surgery during the day but you may remove then at night for sleeping. ° °MEDICATIONS °See your medication summary on the “After Visit Summary” that the nursing staff will review with you prior to discharge.  You may have some home medications which will be placed on hold until you complete the course of blood thinner medication.  It is important for you to complete the blood thinner medication as prescribed by your surgeon.  Continue your approved medications as instructed at time of discharge. ° °PRECAUTIONS °If you experience chest pain or shortness of breath - call 911 immediately for transfer to the hospital emergency department.  °If you develop a fever greater that 101 F,  purulent drainage from wound, increased redness or drainage from wound, foul odor from the wound/dressing, or calf pain - CONTACT YOUR SURGEON.   °                                                °FOLLOW-UP APPOINTMENTS °Make sure you keep all of your appointments after your operation with your surgeon and caregivers. You should call the office at the above phone number and make an appointment for approximately two weeks after the date of your surgery or on the date instructed by your surgeon outlined in the "After Visit Summary". ° °RANGE OF MOTION AND STRENGTHENING EXERCISES  °These exercises are designed to help you keep full movement of your hip joint. Follow your caregiver's or physical therapist's instructions. Perform all exercises about fifteen times, three times per day or as directed. Exercise both hips, even if you   have had only one joint replacement. These exercises can be done on a training (exercise) mat, on the floor, on a table or on a bed. Use whatever works the best and is most comfortable for you. Use music or television while you are exercising so that the exercises are a pleasant break in your day. This will make your life better with the exercises acting as a break in routine you can look forward to.  °Lying on your back, slowly slide your foot toward your buttocks, raising your knee up off the floor. Then slowly slide your foot back down until your leg is straight again.  °Lying on your back spread your legs as far apart as you can without causing discomfort.  °Lying on your side, raise your upper leg and foot straight up from the floor as far as is comfortable. Slowly lower the leg and repeat.  °Lying on your back, tighten up the muscle in the front of your thigh (quadriceps muscles). You can do this by keeping your leg straight and trying to raise your heel off the floor. This helps strengthen the largest muscle supporting your knee.  °Lying on your back, tighten up the muscles of your  buttocks both with the legs straight and with the knee bent at a comfortable angle while keeping your heel on the floor.  ° °IF YOU ARE TRANSFERRED TO A SKILLED REHAB FACILITY °If the patient is transferred to a skilled rehab facility following release from the hospital, a list of the current medications will be sent to the facility for the patient to continue.  When discharged from the skilled rehab facility, please have the facility set up the patient's Home Health Physical Therapy prior to being released. Also, the skilled facility will be responsible for providing the patient with their medications at time of release from the facility to include their pain medication, the muscle relaxants, and their blood thinner medication. If the patient is still at the rehab facility at time of the two week follow up appointment, the skilled rehab facility will also need to assist the patient in arranging follow up appointment in our office and any transportation needs. ° °MAKE SURE YOU:  °Understand these instructions.  °Get help right away if you are not doing well or get worse.  ° ° °Pick up stool softner and laxative for home use following surgery while on pain medications. °Do not submerge incision under water. °Please use good hand washing techniques while changing dressing each day. °May shower starting three days after surgery. °Please use a clean towel to pat the incision dry following showers. °Continue to use ice for pain and swelling after surgery. °Do not use any lotions or creams on the incision until instructed by your surgeon. ° °Take Xarelto for two and a half more weeks, then discontinue Xarelto. °Once the patient has completed the Xarelto, they may resume the 81 mg Aspirin. ° ° ° °Information on my medicine - XARELTO® (Rivaroxaban) ° °This medication education was reviewed with me or my healthcare representative as part of my discharge preparation.  ° °Why was Xarelto® prescribed for you? °Xarelto® was  prescribed for you to reduce the risk of blood clots forming after orthopedic surgery. The medical term for these abnormal blood clots is venous thromboembolism (VTE). ° °What do you need to know about xarelto® ? °Take your Xarelto® ONCE DAILY at the same time every day. °You may take it either with or without food. ° °If you have difficulty   the tablet whole, you may crush it and mix in applesauce just prior to taking your dose.  Take Xarelto exactly as prescribed by your doctor and DO NOT stop taking Xarelto without talking to the doctor who prescribed the medication.  Stopping without other VTE prevention medication to take the place of Xarelto may increase your risk of developing a clot.  After discharge, you should have regular check-up appointments with your healthcare provider that is prescribing your Xarelto.    What do you do if you miss a dose? If you miss a dose, take it as soon as you remember on the same day then continue your regularly scheduled once daily regimen the next day. Do not take two doses of Xarelto on the same day.   Important Safety Information A possible side effect of Xarelto is bleeding. You should call your healthcare provider right away if you experience any of the following: ? Bleeding from an injury or your nose that does not stop. ? Unusual colored urine (red or dark brown) or unusual colored stools (red or black). ? Unusual bruising for unknown reasons. ? A serious fall or if you hit your head (even if there is no bleeding).  Some medicines may interact with Xarelto and might increase your risk of bleeding while on Xarelto. To help avoid this, consult your healthcare provider or pharmacist prior to using any new prescription or non-prescription medications, including herbals, vitamins, non-steroidal anti-inflammatory drugs (NSAIDs) and supplements.  This website has more information on Xarelto: [email or web address]

## 2015-05-31 NOTE — Care Management Note (Signed)
Case Management Note  Patient Details  Name: MAHENDER SLIDER MRN: [id number] Date of Birth: 07/24/1950  Subjective/Objective:        S/p Left total hip arthroplasty, anterior approach            Action/Plan: Discharge planning, spoke with patient at bedside. Have chosen [facility] for [facility]. Contacted [facility] for referral. Has RW at home, requesting 3-n-1, contacted [facility] to deliver to room.  Expected Discharge Date:                  Expected Discharge Plan:  [place]  In-House Referral:  NA  Discharge planning Services  CM Consult  Post Acute Care Choice:  Home Health, Durable Medical Equipment Choice offered to:  Patient  DME Arranged:  3-N-1 DME Agency:  [place]:  PT [place]:  [place]  Status of Service:  Completed, signed off  Medicare Important Message Given:    Date Medicare IM Given:    Medicare IM give by:    Date Additional Medicare IM Given:    Additional Medicare Important Message give by:     If discussed at [place] of Stay Meetings, dates discussed:    Additional Comments:  Guadalupe Maple, RN 05/31/2015, 10:39 AM

## 2015-05-31 NOTE — Evaluation (Signed)
Physical Therapy Evaluation Patient Details Name: Ian Park MRN: [id number] DOB: Dec 01, 1950 Today's Date: 05/31/2015   History of Present Illness  Pt is a 64 year old male s/p L Direct anterior THA  Clinical Impression  Pt is s/p L THA resulting in the deficits listed below (see PT Problem List).  Pt will benefit from skilled PT to increase their independence and safety with mobility to allow discharge to the venue listed below.  Pt mobilizing well POD#1.  Pt plans to d/c home possibly later today.  Son to assist at home and will likely be visiting after lunch so plan to practice steps this afternoon.     Follow Up Recommendations Outpatient PT;Home health PT    Equipment Recommendations  None recommended by PT    Recommendations for Other Services       Precautions / Restrictions Precautions Precautions: None Restrictions Other Position/Activity Restrictions: WBAT      Mobility  Bed Mobility               General bed mobility comments: sitting EOB on arrival  Transfers Overall transfer level: Needs assistance Equipment used: Rolling walker (2 wheeled) Transfers: Sit to/from Stand Sit to Stand: Min guard         General transfer comment: verbal cues for safe technique  Ambulation/Gait Ambulation/Gait assistance: Min guard Ambulation Distance (Feet): 180 Feet Assistive device: Rolling walker (2 wheeled) Gait Pattern/deviations: Step-to pattern;Step-through pattern;Antalgic;Decreased stride length     General Gait Details: verbal cues for sequence, able to progress to step through with shorter steps, slight difficulty with L hip/knee flexion during swing  Stairs            Wheelchair Mobility    Modified Rankin (Stroke Patients Only)       Balance                                             Pertinent Vitals/Pain Pain Assessment: 0-10 Pain Score: 4  Pain Location: L hip Pain Descriptors / Indicators: Sore;Aching Pain  Intervention(s): Limited activity within patient's tolerance;Premedicated before session;Repositioned;Monitored during session;Ice applied    Home Living Family/patient expects to be discharged to:: Private residence Living Arrangements: Children (son) Available Help at Discharge: Available PRN/intermittently Type of Home: House Home Access: Stairs to enter   Technical brewer of Steps: 8 Home Layout: Two level Home Equipment: Walker - 2 wheels      Prior Function Level of Independence: Independent               Hand Dominance        Extremity/Trunk Assessment               Lower Extremity Assessment: LLE deficits/detail   LLE Deficits / Details: L hip flexion and abduction 2+/5     Communication   Communication: No difficulties  Cognition Arousal/Alertness: Awake/alert Behavior During Therapy: WFL for tasks assessed/performed Overall Cognitive Status: Within Functional Limits for tasks assessed                      General Comments      Exercises Total Joint Exercises Ankle Circles/Pumps: AROM;Both;10 reps Quad Sets: AROM;Both;10 reps Hip ABduction/ADduction: AAROM;Left;10 reps Straight Leg Raises: AAROM;Left;10 reps Long Arc Quad: AROM;Seated;Left;10 reps (closer to SAQ from seated position) Marching in Standing: AROM;Seated;Left;10 reps      Assessment/Plan  PT Assessment Patient needs continued PT services  PT Diagnosis Acute pain;Difficulty walking   PT Problem List Decreased strength;Decreased mobility;Decreased knowledge of use of DME;Pain  PT Treatment Interventions Functional mobility training;Stair training;Gait training;DME instruction;Patient/family education;Therapeutic activities;Therapeutic exercise   PT Goals (Current goals can be found in the Care Plan section) Acute Rehab PT Goals PT Goal Formulation: With patient Time For Goal Achievement: 06/04/15 Potential to Achieve Goals: Good    Frequency 7X/week    Barriers to discharge        Co-evaluation               End of Session Equipment Utilized During Treatment: Gait belt Activity Tolerance: Patient tolerated treatment well Patient left: in chair;with call bell/phone within reach           Time: 1005-1024 PT Time Calculation (min) (ACUTE ONLY): 19 min   Charges:   PT Evaluation $Initial PT Evaluation Tier I: 1 Procedure     PT G Codes:        Izzah Pasqua,KATHrine E 05/31/2015, 12:35 PM Carmelia Bake, PT, DPT 05/31/2015 Pager: [phone]

## 2015-05-31 NOTE — Progress Notes (Signed)
   Subjective: 1 Day Post-Op Procedure(s) (LRB): LEFT TOTAL HIP ARTHROPLASTY ANTERIOR APPROACH (Left) Patient reports pain as mild.   Patient seen in rounds with Dr. Wynelle Link. Patient is well, and has had no acute complaints or problems except for starting hiccups this morning. We will start therapy today.  If they do well with therapy and meets all goals, then will allow home later this afternoon following therapy. Plan is to go Home after hospital stay.  Objective: Vital signs in last 24 hours: Temp:  [97.4 F (36.3 C)-98.3 F (36.8 C)] 98.1 F (36.7 C) (12/20 0428) Pulse Rate:  [50-71] 65 (12/20 0428) Resp:  [9-19] 16 (12/20 0428) BP: (108-141)/(56-82) 108/56 mmHg (12/20 0428) SpO2:  [94 %-100 %] 94 % (12/20 0428) Weight:  [95.425 kg (210 lb 6 oz)] 95.425 kg (210 lb 6 oz) (12/19 1151)  Intake/Output from previous day:  Intake/Output Summary (Last 24 hours) at 05/31/15 0738 Last data filed at 05/31/15 [id number]  Gross per 24 hour  Intake 3568.67 ml  Output   1985 ml  Net 1583.67 ml    Labs:  Recent Labs  05/31/15 0415  HGB 12.3*    Recent Labs  05/31/15 0415  WBC 10.7*  RBC 4.00*  HCT 37.1*  PLT 219    Recent Labs  05/31/15 0415  NA 134*  K 4.6  CL 102  CO2 26  BUN 16  CREATININE 0.95  GLUCOSE 134*  CALCIUM 8.7*   No results for input(s): LABPT, INR in the last 72 hours.  EXAM General - Patient is Alert, Appropriate and Oriented Extremity - Neurovascular intact Sensation intact distally Dressing - dressing C/D/I Motor Function - intact, moving foot and toes well on exam.  Hemovac pulled without difficulty.  Past Medical History  Diagnosis Date  . 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 Cancer ([place])     lymphoma  . Hypercholesterolemia   . Kidney stone   . History of chemotherapy 2011  . Headache   . Arthritis     oa, hips and right shoulder  . Complication of anesthesia     "slow to wake up and PEE"  . Birthmark    right leg portwine stain birthmark  . Shin injury     red area no drainage pt says present for last 30 years    Assessment/Plan: 1 Day Post-Op Procedure(s) (LRB): LEFT TOTAL HIP ARTHROPLASTY ANTERIOR APPROACH (Left) Principal Problem:   OA (osteoarthritis) of hip  Estimated body mass index is 32.94 kg/(m^2) as calculated from the following:   Height as of this encounter: 5\' 7"  (1.702 m).   Weight as of this encounter: 95.425 kg (210 lb 6 oz). Advance diet Up with therapy Discharge home with home health  DVT Prophylaxis - Xarelto Weight Bearing As Tolerated left Leg Hemovac Pulled Begin Therapy  If meets goals and able to go home: Up with therapy Discharge home with home health if meets goals Diet - Cardiac diet Follow up - in 2 weeks Activity - WBAT Disposition - Home Condition Upon Discharge - Pending on therapy D/C Meds - See DC Summary DVT Prophylaxis - Xarelto  Arlee Muslim, PA-C Orthopaedic Surgery 05/31/2015, 7:38 AM

## 2015-05-31 NOTE — Progress Notes (Signed)
Physical Therapy Treatment Note    05/31/15 1540  PT Visit Information  Last PT Received On 05/31/15  Assistance Needed +1  History of Present Illness Pt is a 64 year old male s/p L Direct anterior THA  PT Time Calculation  PT Start Time (ACUTE ONLY) 1404  PT Stop Time (ACUTE ONLY) 1414  PT Time Calculation (min) (ACUTE ONLY) 10 min  Subjective Data  Subjective Pt ambulated to/from steps.  Pt's son present and both pt and son educated on safe stair technique.  Provided and reviewed verbally HEP with pt.  Pt had no further questions and feels ready for d/c home today.  Precautions  Precautions None  Restrictions  Other Position/Activity Restrictions WBAT  Pain Assessment  Pain Assessment 0-10  Pain Score 3  Pain Location L hip  Pain Descriptors / Indicators Aching;Sore  Pain Intervention(s) Limited activity within patient's tolerance;Monitored during session  Cognition  Arousal/Alertness Awake/alert  Behavior During Therapy WFL for tasks assessed/performed  Overall Cognitive Status Within Functional Limits for tasks assessed  Bed Mobility  General bed mobility comments pt up in recliner  Transfers  Overall transfer level Needs assistance  Equipment used Rolling walker (2 wheeled)  Transfers Sit to/from Stand  Sit to Stand Supervision  General transfer comment verbal cues for L LE forward  Ambulation/Gait  Ambulation/Gait assistance Min guard;Supervision  Ambulation Distance (Feet) 40 Feet  Assistive device Rolling walker (2 wheeled)  Gait Pattern/deviations Step-to pattern;Antalgic  General Gait Details verbal cues for sequence and pain control (short steps, WBing through RW)  Stairs Yes  Stairs assistance Min guard  Stair Management One rail Right;Step to pattern;Forwards  Number of Stairs 2  General stair comments verbal cues for sequence and safety, performed 2 steps twice, pt and son educated on safe technique  PT - End of Session  Equipment Utilized During  Treatment Gait belt  Activity Tolerance Patient tolerated treatment well  Patient left in chair;with call bell/phone within reach;with family/visitor present  PT - Assessment/Plan  PT Plan Current plan remains appropriate  PT Frequency (ACUTE ONLY) 7X/week  Follow Up Recommendations Outpatient PT;Home health PT  PT equipment None recommended by PT  PT Goal Progression  Progress towards PT goals Progressing toward goals  PT General Charges  $$ ACUTE PT VISIT 1 Procedure  PT Treatments  $Gait Training 8-22 mins   Carmelia Bake, PT, DPT 05/31/2015 Pager: [phone]

## 2015-05-31 NOTE — Progress Notes (Signed)
Occupational Therapy Evaluation Patient Details Name: Ian Park MRN: [id number] DOB: 1950/12/03 Today's Date: 05/31/2015    History of Present Illness Pt is a 64 year old male s/p L Direct anterior THA   Clinical Impression   Pt admitted with the above diagnoses and presents with below problem list. Pt will benefit from continued acute OT to address the below listed deficits and maximize independence with BADLs prior to d/c home. PTA pt was independent with ADLs. Pt is currently min guard with LB ADLs and toilet transfers; min A for tub transfer. ADL education provided. OT to continue to follow acutely.      Follow Up Recommendations  Supervision - Intermittent;No OT follow up    Equipment Recommendations  3 in 1 bedside comode;Other (comment) (has been delivered to room)    Recommendations for Other Services       Precautions / Restrictions Precautions Precautions: None Restrictions Other Position/Activity Restrictions: WBAT      Mobility Bed Mobility               General bed mobility comments: in recliner  Transfers Overall transfer level: Needs assistance Equipment used: Rolling walker (2 wheeled) Transfers: Sit to/from Stand Sit to Stand: Min guard         General transfer comment: from recliner and 3n1, cues for technique with rw    Balance Overall balance assessment: Needs assistance Sitting-balance support: No upper extremity supported;Feet supported Sitting balance-Leahy Scale: Good     Standing balance support: Bilateral upper extremity supported;During functional activity Standing balance-Leahy Scale: Fair                              ADL Overall ADL's : Needs assistance/impaired Eating/Feeding: Set up;Sitting   Grooming: Min guard;Standing   Upper Body Bathing: Set up;Sitting   Lower Body Bathing: Min guard;Sit to/from stand;With adaptive equipment   Upper Body Dressing : Set up;Sitting   Lower Body Dressing: Min  guard;With adaptive equipment;Sit to/from stand   Toilet Transfer: Min guard;Ambulation;RW (3n1 over toilet)   Toileting- Clothing Manipulation and Hygiene: Min guard;Sit to/from stand;Set up;Sitting/lateral lean   Tub/ Shower Transfer: Minimal assistance;Tub transfer;3 in 1;Rolling walker Tub/Shower Transfer Details (indicate cue type and reason): simulated stepping over tub wall. Min A for steadying balance. Discussed having someone with him when transferring in/out of shower.  Functional mobility during ADLs: Min guard;Rolling walker General ADL Comments: ADL education completed including strategies, AE, DME, and home setup. Completed toilet transfer and simulated tub shower transfer.      Vision     Perception     Praxis      Pertinent Vitals/Pain Pain Assessment: 0-10 Pain Score: 2  Pain Location: L hip Pain Descriptors / Indicators: Aching;Sore Pain Intervention(s): Monitored during session;Repositioned     Hand Dominance     Extremity/Trunk Assessment Upper Extremity Assessment Upper Extremity Assessment: Overall WFL for tasks assessed   Lower Extremity Assessment Lower Extremity Assessment: Defer to PT evaluation LLE Deficits / Details: L hip flexion and abduction 2+/5       Communication Communication Communication: No difficulties   Cognition Arousal/Alertness: Awake/alert Behavior During Therapy: WFL for tasks assessed/performed Overall Cognitive Status: Within Functional Limits for tasks assessed                     General Comments       Exercises       Shoulder Instructions  Home Living Family/patient expects to be discharged to:: Private residence Living Arrangements: Children (son) Available Help at Discharge: Available PRN/intermittently Type of Home: House Home Access: Stairs to enter Technical brewer of Steps: 8   Home Layout: Two level;Bed/bath upstairs Alternate Level Stairs-Number of Steps: flight Alternate Level  Stairs-Rails: Right Bathroom Shower/Tub: Tub/shower unit Shower/tub characteristics: Curtain Biochemist, clinical: Standard     Home Equipment: Environmental consultant - 2 wheels   Additional Comments: Plans to have son help him get downstairs in the morning and then stay on the main level. Has neighbors who are around during the day. Discussed home setup and timing of ADLs to maximize safety.       Prior Functioning/Environment Level of Independence: Independent             OT Diagnosis: Acute pain   OT Problem List: Impaired balance (sitting and/or standing);Decreased knowledge of use of DME or AE;Decreased knowledge of precautions;Pain   OT Treatment/Interventions: Self-care/ADL training;DME and/or AE instruction;Therapeutic activities;Patient/family education;Balance training    OT Goals(Current goals can be found in the care plan section) Acute Rehab OT Goals Patient Stated Goal: not stated OT Goal Formulation: With patient Time For Goal Achievement: 06/07/15 Potential to Achieve Goals: Good ADL Goals Pt Will Perform Lower Body Bathing: with modified independence;sit to/from stand;with adaptive equipment Pt Will Perform Lower Body Dressing: with modified independence;with adaptive equipment;sit to/from stand Pt Will Transfer to Toilet: with modified independence;ambulating (3n1 over toilet) Pt Will Perform Toileting - Clothing Manipulation and hygiene: with modified independence;sit to/from stand;sitting/lateral leans Pt Will Perform Tub/Shower Transfer: Tub transfer;with supervision;ambulating;3 in 1;rolling walker  OT Frequency: Min 2X/week   Barriers to D/C:            Co-evaluation              End of Session Equipment Utilized During Treatment: Gait belt;Rolling walker  Activity Tolerance: Patient tolerated treatment well Patient left: in chair;with call bell/phone within reach;with family/visitor present   Time: [id number] OT Time Calculation (min): 26 min Charges:  OT  General Charges $OT Visit: 1 Procedure OT Evaluation $Initial OT Evaluation Tier I: 1 Procedure OT Treatments $Self Care/Home Management : 8-22 mins G-Codes:    Hortencia Pilar 2015-06-03, 2:11 PM

## 2015-09-16 ENCOUNTER — Telehealth: Payer: Self-pay | Admitting: Oncology

## 2015-09-16 NOTE — Telephone Encounter (Signed)
returned call and lvm for pt to call back to r/s °

## 2015-09-20 ENCOUNTER — Ambulatory Visit: Payer: 59 | Admitting: Oncology

## 2015-10-28 ENCOUNTER — Other Ambulatory Visit: Payer: Self-pay | Admitting: Urology

## 2015-10-31 ENCOUNTER — Other Ambulatory Visit: Payer: Self-pay | Admitting: Urology

## 2015-12-14 ENCOUNTER — Encounter (HOSPITAL_COMMUNITY): Payer: Self-pay

## 2015-12-15 ENCOUNTER — Encounter (HOSPITAL_COMMUNITY): Payer: Self-pay

## 2015-12-15 ENCOUNTER — Ambulatory Visit (HOSPITAL_COMMUNITY)
Admission: RE | Admit: 2015-12-15 | Discharge: 2015-12-15 | Disposition: A | Discharge status: Home / Self Care | Payer: No Typology Code available for payment source | Source: Ambulatory Visit | Attending: Urology | Admitting: Urology

## 2015-12-15 ENCOUNTER — Encounter (HOSPITAL_COMMUNITY)
Admission: RE | Admit: 2015-12-15 | Discharge: 2015-12-15 | Disposition: A | Discharge status: Home / Self Care | Payer: No Typology Code available for payment source | Source: Ambulatory Visit | Attending: Urology | Admitting: Urology

## 2015-12-15 DIAGNOSIS — C61 Malignant neoplasm of prostate: Secondary | ICD-10-CM | POA: Diagnosis not present

## 2015-12-15 DIAGNOSIS — Z01818 Encounter for other preprocedural examination: Secondary | ICD-10-CM

## 2015-12-15 HISTORY — DX: Allergy status to unspecified drugs, medicaments and biological substances: Z88.9

## 2015-12-15 HISTORY — DX: Personal history of urinary calculi: Z87.442

## 2015-12-15 HISTORY — DX: Malignant neoplasm of prostate: C61

## 2015-12-15 LAB — CBC
HCT: 40.7 % (ref 39.0–52.0)
HEMOGLOBIN: 14.2 g/dL (ref 13.0–17.0)
MCH: 31.3 pg (ref 26.0–34.0)
MCHC: 34.9 g/dL (ref 30.0–36.0)
MCV: 89.6 fL (ref 78.0–100.0)
Platelets: 262 10*3/uL (ref 150–400)
RBC: 4.54 MIL/uL (ref 4.22–5.81)
RDW: 13.1 % (ref 11.5–15.5)
WBC: 4.1 10*3/uL (ref 4.0–10.5)

## 2015-12-15 LAB — BASIC METABOLIC PANEL
Anion gap: 6 (ref 5–15)
BUN: 16 mg/dL (ref 6–20)
CALCIUM: 8.9 mg/dL (ref 8.9–10.3)
CHLORIDE: 106 mmol/L (ref 101–111)
CO2: 25 mmol/L (ref 22–32)
CREATININE: 0.84 mg/dL (ref 0.61–1.24)
GLUCOSE: 115 mg/dL — AB (ref 65–99)
Potassium: 4.3 mmol/L (ref 3.5–5.1)
Sodium: 137 mmol/L (ref 135–145)

## 2015-12-15 LAB — SURGICAL PCR SCREEN
MRSA, PCR: NEGATIVE
Staphylococcus aureus: NEGATIVE

## 2015-12-15 NOTE — Patient Instructions (Addendum)
Ian Park  12/15/2015   Your procedure is scheduled on: 12-22-15   Report to [facility] Main  Entrance take [facility]  elevators to 3rd floor to  [place] at  1045  AM.  Call this number if you have problems the morning of surgery [phone] Magnesium Citrate 1 bottle -8-10 OZ BY NOON DAY BEFORE PROCEDURE. PATIENT WILL BE INSTRUCTED TO GET THIS FROM THE PHARMACY.  1 Fleet Enema night before surgery. (Drink clear liquids Plentiful- 24 hours before surgery.                  Remember: ONLY 1 PERSON MAY GO WITH YOU TO SHORT STAY TO GET  READY MORNING OF YOUR SURGERY.  Do not eat food or drink liquids :After Midnight.     Take these medicines the morning of surgery with A SIP OF WATER: ZYRTEC. LIPITOR. DO NOT TAKE ANY DIABETIC MEDICATIONS DAY OF YOUR SURGERY                               You may not have any metal on your body including hair pins and              piercings  Do not wear jewelry, make-up, lotions, powders or perfumes, deodorant             Do not wear nail polish.  Do not shave  48 hours prior to surgery.              Men may shave face and neck.   Do not bring valuables to the hospital. [place].  Contacts, dentures or bridgework may not be worn into surgery.  Leave suitcase in the car. After surgery it may be brought to your room.     Patients discharged the day of surgery will not be allowed to drive home.  Name and phone number of your driver:Ian Park, son- [phone] cell  Special Instructions: N/A              Please read over the following fact sheets you were given: _____________________________________________________________________             [facility] - Preparing for Surgery Before surgery, you can play an important role.  Because skin is not sterile, your skin needs to be as free of germs as possible.  You can reduce the number of germs on your skin by  washing with CHG (chlorahexidine gluconate) soap before surgery.  CHG is an antiseptic cleaner which kills germs and bonds with the skin to continue killing germs even after washing. Please DO NOT use if you have an allergy to CHG or antibacterial soaps.  If your skin becomes reddened/irritated stop using the CHG and inform your nurse when you arrive at Short Stay. Do not shave (including legs and underarms) for at least 48 hours prior to the first CHG shower.  You may shave your face/neck. Please follow these instructions carefully:  1.  Shower with CHG Soap the night before surgery and the  morning of Surgery.  2.  If you choose to wash your hair, wash your hair first as usual with your  normal  shampoo.  3.  After you shampoo, rinse your hair and  body thoroughly to remove the  shampoo.                           4.  Use CHG as you would any other liquid soap.  You can apply chg directly  to the skin and wash                       Gently with a scrungie or clean washcloth.  5.  Apply the CHG Soap to your body ONLY FROM THE NECK DOWN.   Do not use on face/ open                           Wound or open sores. Avoid contact with eyes, ears mouth and genitals (private parts).                       Wash face,  Genitals (private parts) with your normal soap.             6.  Wash thoroughly, paying special attention to the area where your surgery  will be performed.  7.  Thoroughly rinse your body with warm water from the neck down.  8.  DO NOT shower/wash with your normal soap after using and rinsing off  the CHG Soap.                9.  Pat yourself dry with a clean towel.            10.  Wear clean pajamas.            11.  Place clean sheets on your bed the night of your first shower and do not  sleep with pets. Day of Surgery : Do not apply any lotions/deodorants the morning of surgery.  Please wear clean clothes to the hospital/surgery center.  FAILURE TO FOLLOW THESE INSTRUCTIONS MAY RESULT IN THE  CANCELLATION OF YOUR SURGERY PATIENT SIGNATURE_________________________________  NURSE SIGNATURE__________________________________  ________________________________________________________________________   Ian Park  An incentive spirometer is a tool that can help keep your lungs clear and active. This tool measures how well you are filling your lungs with each breath. Taking long deep breaths may help reverse or decrease the chance of developing breathing (pulmonary) problems (especially infection) following:  A long period of time when you are unable to move or be active. BEFORE THE PROCEDURE   If the spirometer includes an indicator to show your best effort, your nurse or respiratory therapist will set it to a desired goal.  If possible, sit up straight or lean slightly forward. Try not to slouch.  Hold the incentive spirometer in an upright position. INSTRUCTIONS FOR USE  1. Sit on the edge of your bed if possible, or sit up as far as you can in bed or on a chair. 2. Hold the incentive spirometer in an upright position. 3. Breathe out normally. 4. Place the mouthpiece in your mouth and seal your lips tightly around it. 5. Breathe in slowly and as deeply as possible, raising the piston or the ball toward the top of the column. 6. Hold your breath for 3-5 seconds or for as long as possible. Allow the piston or ball to fall to the bottom of the column. 7. Remove the mouthpiece from your mouth and breathe out normally. 8. Rest for a few seconds and repeat Steps 1 through  7 at least 10 times every 1-2 hours when you are awake. Take your time and take a few normal breaths between deep breaths. 9. The spirometer may include an indicator to show your best effort. Use the indicator as a goal to work toward during each repetition. 10. After each set of 10 deep breaths, practice coughing to be sure your lungs are clear. If you have an incision (the cut made at the time of surgery),  support your incision when coughing by placing a pillow or rolled up towels firmly against it. Once you are able to get out of bed, walk around indoors and cough well. You may stop using the incentive spirometer when instructed by your caregiver.  RISKS AND COMPLICATIONS  Take your time so you do not get dizzy or light-headed.  If you are in pain, you may need to take or ask for pain medication before doing incentive spirometry. It is harder to take a deep breath if you are having pain. AFTER USE  Rest and breathe slowly and easily.  It can be helpful to keep track of a log of your progress. Your caregiver can provide you with a simple table to help with this. If you are using the spirometer at home, follow these instructions: [place] IF:   You are having difficultly using the spirometer.  You have trouble using the spirometer as often as instructed.  Your pain medication is not giving enough relief while using the spirometer.  You develop fever of 100.5 F (38.1 C) or higher. SEEK IMMEDIATE MEDICAL CARE IF:   You cough up bloody sputum that had not been present before.  You develop fever of 102 F (38.9 C) or greater.  You develop worsening pain at or near the incision site. MAKE SURE YOU:   Understand these instructions.  Will watch your condition.  Will get help right away if you are not doing well or get worse. Document Released: 10/08/2006 Document Revised: 08/20/2011 Document Reviewed: 12/09/2006 [facility] Patient Information 2014 [place], [place].   ________________________________________________________________________

## 2015-12-15 NOTE — Pre-Procedure Instructions (Signed)
EKG 10'16 Epic. 

## 2015-12-21 NOTE — H&P (Signed)
Chief Complaint Prostate Cancer and Bladder Calculus with BPH   History of Present Illness Ian Park is a 65 year old gentleman who was noted to have induration of the entire right side of the prostate and an elevated PSA of 6.57 prompting a TRUS biopsy of the prostate on 09/21/15. This revealed an very enlarged 121 cc prostate and he was found to have Gleason 4+3=7 adenocarcinoma of the prostate with 7 out of 12 biopsy cores positive. He has no family history of prostate cancer. He also recently developed gross hematuria and underwent a urologic evaluation including cystoscopy and a hematuria protocol CT scan that demonstrated a 3 cm bladder calculus. His CT scan demonstrated no pelvic or retroperitoneal lymphadenopathy. 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 He is followed by Dr. Alen Blew. TNM stage: cT2b N0 Mx PSA: 6.57 Gleason score: 4+3=7 Biopsy (09/21/15): 7/12 cores positive Left: L apex (10%, 3+3=6), L lateral mid (5%, 3+3=6) Right: R lateral apex (40%, 3+4=7), R mid (20%, 4+3=7), R lateral mid (60%, 4+3=7), R base (< 5%, 3+4=7), R lateral base (5%, 4+3=7) Prostate volume: 121.6 cc Nomogram OC disease: 20% EPE: 76% SVI: 17% LNI: 21% PFS (surgery): 47% at 5 years, 32% at 10 years Urinary function: He does have lower urinary tract symptoms including frequency, intermittency, and incomplete emptying as well as significant nocturia 4 times per night. IPSS is 14. Erectile function: He does have preserved erectile function although has been sexually inactive after his wife passed away 2 years ago. SHIM score is 9 although this is related specifically to inactivity.  Past Medical History Problems  1. History of B-cell lymphoma of extranodal site (C85.19) 2. History of hypercholesterolemia (Z86.39) Surgical History Problems  1. History of Knee Surgery 2. History of  Myringotomy - Left Ear 3. History of Shoulder Surgery 4. History of Total Hip Replacement Current Meds 1. Advil TABS; Therapy: (Recorded:15May2012) to Recorded 2. ALPRAZolam 0.25 MG Oral Tablet; Therapy: (Recorded:29Jul2015) to Recorded 3. Aspirin 81 MG TABS; Therapy: (Recorded:19Oct2011) to Recorded 4. Atorvastatin Calcium 20 MG Oral Tablet; Therapy: (Recorded:06Aug2013) to Recorded 5. Benadryl TABS; Therapy: (Recorded:27Aug2014) to Recorded 6. Multi Vitamin Mens TABS; Therapy: (Recorded:15May2012) to Recorded 7. Saw Palmetto CAPS; Therapy: (Recorded:14Mar2017) to Recorded 8. ZyrTEC Allergy 10 MG Oral Tablet; Therapy: (Recorded:06Aug2013) to Recorded Allergies Medication  1. No Known Drug Allergies Family History Problems  1. Family history of Family Health Status - Mother's Age  23 2. Family history of Family Health Status Number Of Children  2 sons 3. Family history of Father Deceased At Age ___  64, natural causes 4. Family history of Heart Disease : Father Social History Problems   Alcohol Use (History)  1 DAILY  Former smoker [phone])  Marital History - Currently Married  Occupation:  Proofreader Review of Systems Constitutional, skin, eye, otolaryngeal, hematologic/lymphatic, cardiovascular, pulmonary, endocrine, musculoskeletal, gastrointestinal, neurological and psychiatric system(s) were reviewed and pertinent findings if present are noted and are otherwise negative.  Vitals  Weight: 215 lb  BMI Calculated: 33.67 BSA Calculated: 2.09  Physical Exam Constitutional: Well nourished and well developed . No acute distress.  ENT:. The ears and nose are normal in appearance.  Neck: The appearance of the neck is normal and no neck mass is present.  Pulmonary: No respiratory distress, normal respiratory rhythm and effort and clear bilateral breath sounds.  Cardiovascular: Heart rate and rhythm are  normal . No peripheral edema.  Abdomen: The  abdomen is soft and nontender. No masses are palpated. No CVA tenderness. No hernias are palpable. No hepatosplenomegaly noted.   Assessed  1. Prostate cancer (C61) 2. Bladder calculus (N21.0) 3. 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 Prostate cancer:  Considering his BPH and large bladder stone, he does appropriately wish to proceed with surgical treatment of his prostate cancer in order to treat these issues concomitantly. He will be scheduled for a unilateral left nerve sparing robot-assisted laparoscopic radical prostatectomy and bilateral pelvic lymphadenectomy and cystolithotomy. Depending on the size of his bladder neck that is necessary at the time of his prostatectomy, will either remove his bladder stone via his bladder neck or possibly make a separate cystotomy for removal in order not to further compromise his bladder neck.

## 2015-12-22 ENCOUNTER — Encounter (HOSPITAL_COMMUNITY)
Admission: AD | Disposition: A | Discharge status: Home / Self Care | Payer: Self-pay | Source: Ambulatory Visit | Attending: Urology

## 2015-12-22 ENCOUNTER — Inpatient Hospital Stay (HOSPITAL_COMMUNITY)
Admission: AD | Admit: 2015-12-22 | Discharge: 2015-12-23 | DRG: 708 | Disposition: A | Discharge status: Home / Self Care | Payer: No Typology Code available for payment source | Source: Ambulatory Visit | Attending: Urology | Admitting: Urology

## 2015-12-22 ENCOUNTER — Inpatient Hospital Stay (HOSPITAL_COMMUNITY): Payer: No Typology Code available for payment source | Admitting: Anesthesiology

## 2015-12-22 ENCOUNTER — Encounter (HOSPITAL_COMMUNITY): Payer: Self-pay | Admitting: Anesthesiology

## 2015-12-22 DIAGNOSIS — Z9221 Personal history of antineoplastic chemotherapy: Secondary | ICD-10-CM

## 2015-12-22 DIAGNOSIS — C61 Malignant neoplasm of prostate: Principal | ICD-10-CM | POA: Diagnosis present

## 2015-12-22 DIAGNOSIS — E7800 Pure hypercholesterolemia, unspecified: Secondary | ICD-10-CM | POA: Diagnosis present

## 2015-12-22 DIAGNOSIS — Z8249 Family history of ischemic heart disease and other diseases of the circulatory system: Secondary | ICD-10-CM

## 2015-12-22 DIAGNOSIS — Z7982 Long term (current) use of aspirin: Secondary | ICD-10-CM

## 2015-12-22 DIAGNOSIS — Z8572 Personal history of non-Hodgkin lymphomas: Secondary | ICD-10-CM

## 2015-12-22 DIAGNOSIS — N400 Enlarged prostate without lower urinary tract symptoms: Secondary | ICD-10-CM | POA: Diagnosis present

## 2015-12-22 DIAGNOSIS — Z96649 Presence of unspecified artificial hip joint: Secondary | ICD-10-CM | POA: Diagnosis present

## 2015-12-22 DIAGNOSIS — Z87442 Personal history of urinary calculi: Secondary | ICD-10-CM

## 2015-12-22 DIAGNOSIS — Z87891 Personal history of nicotine dependence: Secondary | ICD-10-CM

## 2015-12-22 DIAGNOSIS — N210 Calculus in bladder: Secondary | ICD-10-CM | POA: Diagnosis present

## 2015-12-22 HISTORY — PX: ROBOT ASSISTED LAPAROSCOPIC RADICAL PROSTATECTOMY: SHX5141

## 2015-12-22 HISTORY — PX: LYMPHADENECTOMY: SHX5960

## 2015-12-22 LAB — HEMOGLOBIN AND HEMATOCRIT, BLOOD
HEMATOCRIT: 39 % (ref 39.0–52.0)
HEMOGLOBIN: 12.8 g/dL — AB (ref 13.0–17.0)

## 2015-12-22 LAB — TYPE AND SCREEN
ABO/RH(D): A POS
Antibody Screen: NEGATIVE

## 2015-12-22 SURGERY — XI ROBOTIC ASSISTED LAPAROSCOPIC RADICAL PROSTATECTOMY LEVEL 3
Anesthesia: General

## 2015-12-22 MED ORDER — SULFAMETHOXAZOLE-TRIMETHOPRIM 800-160 MG PO TABS: 1.0000 | ORAL_TABLET | Freq: Two times a day (BID) | ORAL | Status: DC

## 2015-12-22 MED ORDER — DOCUSATE SODIUM 100 MG PO CAPS
100.0000 mg | ORAL_CAPSULE | Freq: Two times a day (BID) | ORAL | Status: DC
  Administered 2015-12-22 – 2015-12-23 (×2): 100 mg via ORAL
  Filled 2015-12-22 (×2): qty 1

## 2015-12-22 MED ORDER — HYDROMORPHONE HCL 2 MG/ML IJ SOLN
INTRAMUSCULAR | Status: AC
  Filled 2015-12-22: qty 1

## 2015-12-22 MED ORDER — HYDROMORPHONE HCL 1 MG/ML IJ SOLN
INTRAMUSCULAR | Status: AC
  Administered 2015-12-22: 0.5 mg via INTRAVENOUS
  Filled 2015-12-22: qty 1

## 2015-12-22 MED ORDER — FENTANYL CITRATE (PF) 100 MCG/2ML IJ SOLN
INTRAMUSCULAR | Status: DC | PRN
  Administered 2015-12-22: 100 ug via INTRAVENOUS
  Administered 2015-12-22 (×2): 50 ug via INTRAVENOUS

## 2015-12-22 MED ORDER — HYDROMORPHONE HCL 1 MG/ML IJ SOLN
0.5000 mg | INTRAMUSCULAR | Status: AC | PRN
Start: 2015-12-22 — End: 2015-12-22
  Administered 2015-12-22 (×4): 0.5 mg via INTRAVENOUS

## 2015-12-22 MED ORDER — MIDAZOLAM HCL 2 MG/2ML IJ SOLN
INTRAMUSCULAR | Status: AC
  Filled 2015-12-22: qty 2

## 2015-12-22 MED ORDER — ATORVASTATIN CALCIUM 10 MG PO TABS
20.0000 mg | ORAL_TABLET | Freq: Every day | ORAL | Status: DC
  Administered 2015-12-22: 20 mg via ORAL
  Filled 2015-12-22 (×2): qty 2

## 2015-12-22 MED ORDER — SODIUM CHLORIDE 0.9 % IV BOLUS (SEPSIS)
1000.0000 mL | Freq: Once | INTRAVENOUS | Status: AC
  Administered 2015-12-22: 1000 mL via INTRAVENOUS

## 2015-12-22 MED ORDER — CEFAZOLIN SODIUM-DEXTROSE 2-4 GM/100ML-% IV SOLN
2.0000 g | INTRAVENOUS | Status: AC
  Administered 2015-12-22 (×2): 2 g via INTRAVENOUS

## 2015-12-22 MED ORDER — KETOROLAC TROMETHAMINE 15 MG/ML IJ SOLN
15.0000 mg | Freq: Four times a day (QID) | INTRAMUSCULAR | Status: DC
  Administered 2015-12-22 – 2015-12-23 (×3): 15 mg via INTRAVENOUS
  Filled 2015-12-22 (×3): qty 1

## 2015-12-22 MED ORDER — PROPOFOL 10 MG/ML IV BOLUS
INTRAVENOUS | Status: DC | PRN
  Administered 2015-12-22: 180 mg via INTRAVENOUS

## 2015-12-22 MED ORDER — FLUORESCEIN SODIUM 10 % IV SOLN
INTRAVENOUS | Status: DC | PRN
  Administered 2015-12-22: .5 mL via INTRAVENOUS

## 2015-12-22 MED ORDER — FLUORESCEIN SODIUM 10 % IV SOLN
INTRAVENOUS | Status: AC
  Filled 2015-12-22: qty 5

## 2015-12-22 MED ORDER — DIPHENHYDRAMINE HCL 12.5 MG/5ML PO ELIX: 12.5000 mg | ORAL_SOLUTION | Freq: Four times a day (QID) | ORAL | Status: DC | PRN

## 2015-12-22 MED ORDER — ONDANSETRON HCL 4 MG/2ML IJ SOLN
INTRAMUSCULAR | Status: DC | PRN
  Administered 2015-12-22: 4 mg via INTRAVENOUS

## 2015-12-22 MED ORDER — LIDOCAINE HCL (CARDIAC) 20 MG/ML IV SOLN
INTRAVENOUS | Status: AC
  Filled 2015-12-22: qty 5

## 2015-12-22 MED ORDER — CEFAZOLIN IN D5W 1 GM/50ML IV SOLN
INTRAVENOUS | Status: AC
  Filled 2015-12-22: qty 100

## 2015-12-22 MED ORDER — FENTANYL CITRATE (PF) 250 MCG/5ML IJ SOLN
INTRAMUSCULAR | Status: AC
  Filled 2015-12-22: qty 5

## 2015-12-22 MED ORDER — CEFAZOLIN SODIUM-DEXTROSE 2-4 GM/100ML-% IV SOLN
INTRAVENOUS | Status: AC
  Filled 2015-12-22: qty 100

## 2015-12-22 MED ORDER — ALPRAZOLAM 0.25 MG PO TABS
0.2500 mg | ORAL_TABLET | Freq: Every evening | ORAL | Status: DC | PRN
  Administered 2015-12-23: 0.25 mg via ORAL
  Filled 2015-12-22: qty 1

## 2015-12-22 MED ORDER — ROCURONIUM BROMIDE 100 MG/10ML IV SOLN
INTRAVENOUS | Status: AC
  Filled 2015-12-22: qty 1

## 2015-12-22 MED ORDER — PROPOFOL 10 MG/ML IV BOLUS
INTRAVENOUS | Status: AC
  Filled 2015-12-22: qty 20

## 2015-12-22 MED ORDER — BUPIVACAINE-EPINEPHRINE 0.25% -1:200000 IJ SOLN
INTRAMUSCULAR | Status: DC | PRN
  Administered 2015-12-22: 30 mL

## 2015-12-22 MED ORDER — SUGAMMADEX SODIUM 200 MG/2ML IV SOLN
INTRAVENOUS | Status: DC | PRN
  Administered 2015-12-22: 200 mg via INTRAVENOUS

## 2015-12-22 MED ORDER — PHENYLEPHRINE HCL 10 MG/ML IJ SOLN
INTRAMUSCULAR | Status: DC | PRN
  Administered 2015-12-22 (×2): 80 ug via INTRAVENOUS
  Administered 2015-12-22 (×2): 40 ug via INTRAVENOUS

## 2015-12-22 MED ORDER — ONDANSETRON HCL 4 MG/2ML IJ SOLN
4.0000 mg | Freq: Once | INTRAMUSCULAR | Status: DC | PRN
Start: 2015-12-22 — End: 2015-12-22

## 2015-12-22 MED ORDER — MIDAZOLAM HCL 5 MG/5ML IJ SOLN
INTRAMUSCULAR | Status: DC | PRN
  Administered 2015-12-22: 2 mg via INTRAVENOUS

## 2015-12-22 MED ORDER — DIPHENHYDRAMINE HCL 50 MG/ML IJ SOLN: 12.5000 mg | Freq: Four times a day (QID) | INTRAMUSCULAR | Status: DC | PRN

## 2015-12-22 MED ORDER — DEXAMETHASONE SODIUM PHOSPHATE 10 MG/ML IJ SOLN
INTRAMUSCULAR | Status: AC
  Filled 2015-12-22: qty 1

## 2015-12-22 MED ORDER — EPHEDRINE SULFATE 50 MG/ML IJ SOLN
INTRAMUSCULAR | Status: DC | PRN
  Administered 2015-12-22: 5 mg via INTRAVENOUS
  Administered 2015-12-22: 10 mg via INTRAVENOUS
  Administered 2015-12-22: 5 mg via INTRAVENOUS

## 2015-12-22 MED ORDER — MORPHINE SULFATE (PF) 2 MG/ML IV SOLN: 2.0000 mg | INTRAVENOUS | Status: DC | PRN

## 2015-12-22 MED ORDER — BUPIVACAINE-EPINEPHRINE 0.25% -1:200000 IJ SOLN
INTRAMUSCULAR | Status: AC
  Filled 2015-12-22: qty 1

## 2015-12-22 MED ORDER — CEFAZOLIN IN D5W 1 GM/50ML IV SOLN
1.0000 g | Freq: Three times a day (TID) | INTRAVENOUS | Status: AC
  Administered 2015-12-23 (×2): 1 g via INTRAVENOUS
  Filled 2015-12-22 (×2): qty 50

## 2015-12-22 MED ORDER — LACTATED RINGERS IV SOLN
INTRAVENOUS | Status: DC | PRN
  Administered 2015-12-22: 13:00:00

## 2015-12-22 MED ORDER — LACTATED RINGERS IV SOLN
INTRAVENOUS | Status: DC
  Administered 2015-12-22: 12:00:00 via INTRAVENOUS

## 2015-12-22 MED ORDER — HEPARIN SODIUM (PORCINE) 1000 UNIT/ML IJ SOLN
INTRAMUSCULAR | Status: AC
  Filled 2015-12-22: qty 1

## 2015-12-22 MED ORDER — KCL IN DEXTROSE-NACL 20-5-0.45 MEQ/L-%-% IV SOLN
INTRAVENOUS | Status: AC
  Administered 2015-12-22: 19:00:00
  Filled 2015-12-22: qty 1000

## 2015-12-22 MED ORDER — ONDANSETRON HCL 4 MG/2ML IJ SOLN
INTRAMUSCULAR | Status: AC
  Filled 2015-12-22: qty 2

## 2015-12-22 MED ORDER — METOCLOPRAMIDE HCL 5 MG/ML IJ SOLN
INTRAMUSCULAR | Status: AC
  Filled 2015-12-22: qty 2

## 2015-12-22 MED ORDER — ACETAMINOPHEN 325 MG PO TABS: 650.0000 mg | ORAL_TABLET | ORAL | Status: DC | PRN

## 2015-12-22 MED ORDER — SODIUM CHLORIDE 0.9 % IR SOLN
Status: DC | PRN
  Administered 2015-12-22: 1000 mL via INTRAVESICAL

## 2015-12-22 MED ORDER — KETOROLAC TROMETHAMINE 15 MG/ML IJ SOLN
INTRAMUSCULAR | Status: AC
  Administered 2015-12-22: 15 mg
  Filled 2015-12-22: qty 1

## 2015-12-22 MED ORDER — LIDOCAINE HCL (CARDIAC) 20 MG/ML IV SOLN
INTRAVENOUS | Status: DC | PRN
  Administered 2015-12-22: 50 mg via INTRAVENOUS

## 2015-12-22 MED ORDER — KCL IN DEXTROSE-NACL 20-5-0.45 MEQ/L-%-% IV SOLN
INTRAVENOUS | Status: DC
  Administered 2015-12-22 – 2015-12-23 (×2): via INTRAVENOUS
  Filled 2015-12-22 (×4): qty 1000

## 2015-12-22 MED ORDER — SUGAMMADEX SODIUM 200 MG/2ML IV SOLN
INTRAVENOUS | Status: AC
  Filled 2015-12-22: qty 2

## 2015-12-22 MED ORDER — PHENYLEPHRINE 40 MCG/ML (10ML) SYRINGE FOR IV PUSH (FOR BLOOD PRESSURE SUPPORT)
PREFILLED_SYRINGE | INTRAVENOUS | Status: AC
  Filled 2015-12-22: qty 10

## 2015-12-22 MED ORDER — LORATADINE 10 MG PO TABS
10.0000 mg | ORAL_TABLET | Freq: Every day | ORAL | Status: DC
  Administered 2015-12-22: 10 mg via ORAL
  Filled 2015-12-22 (×2): qty 1

## 2015-12-22 MED ORDER — HYDROCODONE-ACETAMINOPHEN 5-325 MG PO TABS: 1.0000 | ORAL_TABLET | Freq: Four times a day (QID) | ORAL | Status: DC | PRN

## 2015-12-22 MED ORDER — ROCURONIUM BROMIDE 100 MG/10ML IV SOLN
INTRAVENOUS | Status: DC | PRN
  Administered 2015-12-22 (×3): 10 mg via INTRAVENOUS
  Administered 2015-12-22: 50 mg via INTRAVENOUS
  Administered 2015-12-22 (×2): 10 mg via INTRAVENOUS
  Administered 2015-12-22: 20 mg via INTRAVENOUS

## 2015-12-22 MED ORDER — HYDROMORPHONE HCL 1 MG/ML IJ SOLN
INTRAMUSCULAR | Status: DC | PRN
  Administered 2015-12-22: 1 mg via INTRAVENOUS
  Administered 2015-12-22 (×2): 0.5 mg via INTRAVENOUS

## 2015-12-22 SURGICAL SUPPLY — 56 items
APPLICATOR COTTON TIP 6IN STRL (MISCELLANEOUS) ×3 IMPLANT
BAG RETRIEVAL 10 (BASKET) ×2
CATH FOLEY 2WAY SLVR 18FR 30CC (CATHETERS) ×3 IMPLANT
CATH ROBINSON RED A/P 16FR (CATHETERS) ×3 IMPLANT
CATH ROBINSON RED A/P 8FR (CATHETERS) ×3 IMPLANT
CATH TIEMANN FOLEY 18FR 5CC (CATHETERS) ×3 IMPLANT
CHLORAPREP W/TINT 26ML (MISCELLANEOUS) ×3 IMPLANT
CLIP LIGATING HEM O LOK PURPLE (MISCELLANEOUS) ×7 IMPLANT
COVER SURGICAL LIGHT HANDLE (MISCELLANEOUS) ×3 IMPLANT
COVER TIP SHEARS 8 DVNC (MISCELLANEOUS) ×2 IMPLANT
COVER TIP SHEARS 8MM DA VINCI (MISCELLANEOUS) ×1
CUTTER ECHEON FLEX ENDO 45 340 (ENDOMECHANICALS) ×3 IMPLANT
DECANTER SPIKE VIAL GLASS SM (MISCELLANEOUS) ×3 IMPLANT
DRAPE ARM DVNC X/XI (DISPOSABLE) ×8 IMPLANT
DRAPE COLUMN DVNC XI (DISPOSABLE) ×2 IMPLANT
DRAPE DA VINCI XI ARM (DISPOSABLE) ×4
DRAPE DA VINCI XI COLUMN (DISPOSABLE) ×1
DRAPE SURG IRRIG POUCH 19X23 (DRAPES) ×3 IMPLANT
DRSG TEGADERM 4X4.75 (GAUZE/BANDAGES/DRESSINGS) ×3 IMPLANT
ELECT REM PT RETURN 9FT ADLT (ELECTROSURGICAL) ×3
ELECTRODE REM PT RTRN 9FT ADLT (ELECTROSURGICAL) ×2 IMPLANT
GAUZE SPONGE 2X2 8PLY STRL LF (GAUZE/BANDAGES/DRESSINGS) IMPLANT
GLOVE BIO SURGEON STRL SZ 6.5 (GLOVE) ×3 IMPLANT
GLOVE BIOGEL M STRL SZ7.5 (GLOVE) ×6 IMPLANT
GOWN STRL REUS W/TWL LRG LVL3 (GOWN DISPOSABLE) ×9 IMPLANT
HEMOSTAT SURGICEL 2X3 (HEMOSTASIS) ×1 IMPLANT
HOLDER FOLEY CATH W/STRAP (MISCELLANEOUS) ×3 IMPLANT
IV LACTATED RINGERS 1000ML (IV SOLUTION) ×3 IMPLANT
LIQUID BAND (GAUZE/BANDAGES/DRESSINGS) ×1 IMPLANT
NDL SAFETY ECLIPSE 18X1.5 (NEEDLE) ×2 IMPLANT
NEEDLE HYPO 18GX1.5 SHARP (NEEDLE) ×3
PACK ROBOT UROLOGY CUSTOM (CUSTOM PROCEDURE TRAY) ×3 IMPLANT
RELOAD GREEN ECHELON 45 (STAPLE) ×3 IMPLANT
SEAL CANN UNIV 5-8 DVNC XI (MISCELLANEOUS) ×8 IMPLANT
SEAL XI 5MM-8MM UNIVERSAL (MISCELLANEOUS) ×4
SET TUBE IRRIG SUCTION NO TIP (IRRIGATION / IRRIGATOR) ×3 IMPLANT
SOLUTION ELECTROLUBE (MISCELLANEOUS) ×3 IMPLANT
SPONGE GAUZE 2X2 STER 10/PKG (GAUZE/BANDAGES/DRESSINGS) ×1
SUT ETHILON 3 0 PS 1 (SUTURE) ×3 IMPLANT
SUT MNCRL 3 0 RB1 (SUTURE) ×2 IMPLANT
SUT MNCRL 3 0 VIOLET RB1 (SUTURE) ×2 IMPLANT
SUT MNCRL AB 4-0 PS2 18 (SUTURE) ×6 IMPLANT
SUT MONOCRYL 3 0 RB1 (SUTURE) ×5
SUT VIC AB 0 CT1 27 (SUTURE) ×3
SUT VIC AB 0 CT1 27XBRD ANTBC (SUTURE) ×2 IMPLANT
SUT VIC AB 0 UR5 27 (SUTURE) ×3 IMPLANT
SUT VIC AB 2-0 SH 27 (SUTURE) ×3
SUT VIC AB 2-0 SH 27X BRD (SUTURE) ×2 IMPLANT
SUT VICRYL 0 UR6 27IN ABS (SUTURE) ×6 IMPLANT
SYR 27GX1/2 1ML LL SAFETY (SYRINGE) ×3 IMPLANT
SYS BAG RETRIEVAL 10MM (BASKET) ×4
SYSTEM BAG RETRIEVAL 10MM (BASKET) IMPLANT
TOWEL OR 17X26 10 PK STRL BLUE (TOWEL DISPOSABLE) ×3 IMPLANT
TOWEL OR NON WOVEN STRL DISP B (DISPOSABLE) ×3 IMPLANT
TUBING INSUFFLATION 10FT LAP (TUBING) IMPLANT
WATER STERILE IRR 1500ML POUR (IV SOLUTION) ×4 IMPLANT

## 2015-12-22 NOTE — Discharge Summary (Signed)
Date of admission: 12/22/2015  Date of discharge: 12/23/2015  Admission diagnosis: Prostate Cancer  Discharge diagnosis: Prostate Cancer  History and Physical: For full details, please see admission history and physical. Briefly, Ian Park is a 65 y.o. gentleman with localized prostate cancer.  After discussing management/treatment options, he elected to proceed with surgical treatment.  Hospital Course: Ian Park was taken to the operating room on 12/22/2015 and underwent a robotic assisted laparoscopic radical prostatectomy. He tolerated this procedure well and without complications. Postoperatively, he was able to be transferred to a regular hospital room following recovery from anesthesia.  He was able to begin ambulating the night of surgery. He remained hemodynamically stable overnight.  He had excellent urine output with appropriately minimal output from his pelvic drain and his pelvic drain was removed on POD #1.  He was transitioned to oral pain medication, tolerated a clear liquid diet, and had met all discharge criteria and was able to be discharged home later on POD#1.  Laboratory values:   Recent Labs  12/22/15 1809 12/23/15 0431  HGB 12.8* 11.5*  HCT 39.0 34.1*    Disposition: Home  Discharge instruction:      Discharge Instructions    Discharge instructions    Complete by:  As directed   Activity:  You are encouraged to ambulate frequently (about every hour during waking hours) to help prevent blood clots from forming in your legs or lungs.  However, you should not engage in any heavy lifting (> 10-15 lbs), strenuous activity, or straining. Diet: You should continue a clear liquid diet until passing gas from below.  Once this occurs, you may advance your diet to a soft diet that would be easy to digest (i.e soups, scrambled eggs, mashed potatoes, etc.) for 24 hours just as you would if getting over a bad stomach flu.  If tolerating this diet well for 24 hours, you  may then begin eating regular food.  It will be normal to have some amount of bloating, nausea, and abdominal discomfort intermittently. Prescriptions:  You will be provided a prescription for pain medication to take as needed.  If your pain is not severe enough to require the prescription pain medication, you may take extra strength Tylenol instead.  You should also take an over the counter stool softener (Colace 100 mg twice daily) to avoid straining with bowel movements as the pain medication may constipate you. Finally, you will also be provided a prescription for an antibiotic to begin the day prior to your return visit in the office for catheter removal. Catheter care: You will be taught how to take care of the catheter by the nursing staff prior to discharge from the hospital.  You may use both a leg bag and the larger bedside bag but it is recommended to at least use the bigger bedside bag at nighttime as the leg bag is small and will fill up overnight and also does not drain as well when lying flat. You may periodically feel a strong urge to void with the catheter in place.  This is a bladder spasm and most often can occur when having a bowel movement or when you are moving around. It is typically self-limited and usually will stop after a few minutes.  You may use some Vaseline or Neosporin around the tip of the catheter to reduce friction at the tip of the penis. Incisions: You may remove your dressing bandages the 2nd day after surgery.  You most likely will  have a few small staples in each of the incisions and once the bandages are removed, the incisions may stay open to air.  You may start showering (not soaking or bathing in water) 48 hours after surgery and the incisions simply need to be patted dry after the shower.  No additional care is needed. What to call us about: You should call the office [phone]) if you develop fever > 101, persistent vomiting, or the catheter stops draining. Also,  feel free to call with any other questions you may have and remember the handout that was provided to you as a reference preoperatively which answers many of the common questions that arise after surgery.           Discharge medications:     Medication List    STOP taking these medications        traMADol 50 MG tablet  Commonly known as:  ULTRAM      TAKE these medications        acetaminophen 325 MG tablet  Commonly known as:  TYLENOL  Take 650 mg by mouth every 6 (six) hours as needed for mild pain or headache.     ALPRAZolam 0.25 MG tablet  Commonly known as:  XANAX  Take 0.25 mg by mouth at bedtime as needed for sleep.     atorvastatin 20 MG tablet  Commonly known as:  LIPITOR  Take 20 mg by mouth daily.     cetirizine 10 MG tablet  Commonly known as:  ZYRTEC  Take 10 mg by mouth daily.     diphenhydrAMINE 25 MG tablet  Commonly known as:  BENADRYL  Take 25 mg by mouth every 6 (six) hours as needed for allergies.     HYDROcodone-acetaminophen 5-325 MG tablet  Commonly known as:  NORCO  Take 1-2 tablets by mouth every 6 (six) hours as needed for moderate pain.     sulfamethoxazole-trimethoprim 800-160 MG tablet  Commonly known as:  BACTRIM DS,SEPTRA DS  Take 1 tablet by mouth 2 (two) times daily. Start the day prior to foley removal appointment        Followup: He will followup in 1 week for possible catheter removal after cystogram and to discuss his surgical pathology results.

## 2015-12-22 NOTE — Anesthesia Postprocedure Evaluation (Signed)
Anesthesia Post Note  Patient: Ian Park  Procedure(s) Performed: Procedure(s) (LRB): XI ROBOTIC ASSISTED LAPAROSCOPIC RADICAL PROSTATECTOMY LEVEL 3, AND REMOVAL OF BLADDER CALCULUS (N/A) BILATERAL PELVIC LYMPHADENECTOMY (Bilateral)  Patient location during evaluation: PACU Anesthesia Type: General Level of consciousness: awake and alert Pain management: pain level controlled Vital Signs Assessment: post-procedure vital signs reviewed and stable Respiratory status: spontaneous breathing, nonlabored ventilation, respiratory function stable and patient connected to nasal cannula oxygen Cardiovascular status: blood pressure returned to baseline and stable Postop Assessment: no signs of nausea or vomiting Anesthetic complications: no    Last Vitals:  Filed Vitals:   12/22/15 1035 12/22/15 1720  BP: 126/73 138/73  Pulse: 65 76  Temp: 36.8 C 36.6 C  Resp: 16 14    Last Pain:  Filed Vitals:   12/22/15 1736  PainSc: 5                  Montez Hageman

## 2015-12-22 NOTE — Op Note (Signed)
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 Robotic assisted laparoscopic radical prostatectomy (left nerve sparing) 2. Bilateral robotic assisted laparoscopic pelvic lymphadenectomy 3. Robotic assisted laparoscopic removal of bladder stone  Surgeon: Pryor Curia. M.D.  Assistant(s): Debbrah Alar, PA-C  Resident: Dr. Virginia Crews  Anesthesia: General  Complications: None  EBL: 300 mL  IVF:  2300 mL crystalloid  Specimens: 1. Prostate and seminal vesicles 2. Right pelvic lymph nodes 3. Left pelvic lymph nodes 4. Bladder calculus  Disposition of specimens: Pathology  Drains: 1. 20 Fr coude catheter 2. # 19 Blake pelvic drain  Indication: Ian Park is a 65 y.o. patient with clinically localized prostate cancer, BPH and a bladder calculus.  After a thorough review of the management options for treatment of prostate cancer, he elected to proceed with surgical therapy and the above procedure(s).  We have discussed the potential benefits and risks of the procedure, side effects of the proposed treatment, the likelihood of the patient achieving the goals of the procedure, and any potential problems that might occur during the procedure or recuperation. Informed consent has been obtained.  Description of procedure:  The patient was taken to the operating room and a general anesthetic was administered. He was given preoperative antibiotics, placed in the dorsal lithotomy position, and prepped and draped in the usual sterile fashion. Next a preoperative timeout was performed. A urethral catheter was placed into the bladder and a site was selected near the umbilicus for placement of the camera port. This was placed using a standard open Hassan technique which allowed  entry into the peritoneal cavity under direct vision and without difficulty. An 8 mm port was placed and a pneumoperitoneum established. The camera was then used to inspect the abdomen and there was no evidence of any intra-abdominal injuries or other abnormalities. The remaining abdominal ports were then placed. 8 mm robotic ports were placed in the right lower quadrant, left lower quadrant, and far left lateral abdominal wall. A 5 mm port was placed in the right upper quadrant and a 12 mm port was placed in the right lateral abdominal wall for laparoscopic assistance. All ports were placed under direct vision without difficulty. The surgical cart was then docked.   Utilizing the cautery scissors, the bladder was reflected posteriorly allowing entry into the space of Retzius and identification of the endopelvic fascia and prostate. The periprostatic fat was then removed from the prostate allowing full exposure of the endopelvic fascia. The endopelvic fascia was then incised from the apex back to the base of the prostate bilaterally and the underlying levator muscle fibers were swept laterally off the prostate thereby isolating the dorsal venous complex. The dorsal vein was then stapled and divided with a 45 mm Flex Echelon stapler. Attention then turned to the bladder neck which was divided anteriorly thereby allowing entry into the bladder and exposure of the urethral catheter. The catheter balloon was deflated and the catheter was brought into the operative field and used to retract the prostate anteriorly. The posterior bladder neck was then examined and was divided allowing further dissection between the bladder and prostate posteriorly until the vasa deferentia and seminal vessels were identified. The vasa deferentia were isolated, divided, and lifted anteriorly. The seminal vesicles were dissected down to their tips with care to control the seminal vascular arterial  blood supply. These structures were then  lifted anteriorly and the space between Denonvillier's fascia and the anterior rectum was developed with a combination of sharp and blunt dissection. This isolated the vascular pedicles of the prostate.  The lateral prostatic fascia on the left side of the prostate was then sharply incised allowing release of the neurovascular bundle. The vascular pedicle of the prostate on the left side was then ligated with Weck clips between the prostate and neurovascular bundle and divided with sharp cold scissor dissection resulting in neurovascular bundle preservation. On the right side, a wide non nerve sparing dissection was performed with Weck clips used to ligate the vascular pedicle of the prostate. The neurovascular bundle on the left side was then separated off the apex of the prostate and urethra.  The urethra was then sharply transected allowing the prostate specimen to be disarticulated. The pelvis was copiously irrigated and hemostasis was ensured. There was no evidence for rectal injury.  Attention then turned to the right pelvic sidewall. The fibrofatty tissue between the external iliac vein, confluence of the iliac vessels, hypogastric artery, and Cooper's ligament was dissected free from the pelvic sidewall with care to preserve the obturator nerve. Weck clips were used for lymphostasis and hemostasis. An identical procedure was performed on the contralateral side and the lymphatic packets were removed for permanent pathologic analysis.  A midline cystotomy incision was then made with cautery allowing entry into the bladder.  The large 3 cm bladder stone was identified and was removed.  The cystotomy was then closed in two layers with 3-0 monocryl suture in a watertight fashion.  Attention then turned to the urethral anastomosis. A 2-0 Vicryl slip knot was placed between Denonvillier's fascia, the posterior bladder neck, and the posterior urethra to reapproximate these structures. A double-armed 3-0  Monocryl suture was then used to perform a 360 running tension-free anastomosis between the bladder neck and urethra. A new urethral catheter was then placed into the bladder and irrigated. There were no blood clots within the bladder and the anastomosis appeared to be watertight.  There was no leak from the cystotomy incision.  A #19 Blake drain was then brought through the left lateral 8 mm port site and positioned appropriately within the pelvis. It was secured to the skin with a nylon suture. The surgical cart was then undocked. The right lateral 12 mm port site was closed at the fascial level with a 0 Vicryl suture placed laparoscopically. All remaining ports were then removed under direct vision. The prostate specimen was removed intact within the Endopouch retrieval bag via the periumbilical camera port site. This fascial opening was closed with two running 0 Vicryl sutures. 0.25% Marcaine was then injected into all port sites and all incisions were reapproximated at the skin level with 4-0 Monocryl subcuticular sutures and Dermabond. The patient appeared to tolerate the procedure well and without complications. The patient was able to be extubated and transferred to the recovery unit in satisfactory condition.   Pryor Curia MD

## 2015-12-22 NOTE — Transfer of Care (Signed)
Immediate Anesthesia Transfer of Care Note  Patient: Ian Park  Procedure(s) Performed: Procedure(s): XI ROBOTIC ASSISTED LAPAROSCOPIC RADICAL PROSTATECTOMY LEVEL 3, AND REMOVAL OF BLADDER CALCULUS (N/A) BILATERAL PELVIC LYMPHADENECTOMY (Bilateral)  Patient Location: PACU  Anesthesia Type:General  Level of Consciousness: awake, alert , oriented and patient cooperative  Airway & Oxygen Therapy: Patient Spontanous Breathing and Patient connected to face mask oxygen  Post-op Assessment: Report given to RN, Post -op Vital signs reviewed and stable and Patient moving all extremities  Post vital signs: Reviewed and stable  Last Vitals:  Filed Vitals:   12/22/15 1035  BP: 126/73  Pulse: 65  Temp: 36.8 C  Resp: 16    Last Pain: There were no vitals filed for this visit.    Patients Stated Pain Goal: 3 ([id number])  Complications: No apparent anesthesia complications

## 2015-12-22 NOTE — Anesthesia Procedure Notes (Signed)
Procedure Name: Intubation Date/Time: 12/22/2015 12:54 PM Performed by: Deliah Boston Pre-anesthesia Checklist: Patient identified, Emergency Drugs available, Suction available and Patient being monitored Patient Re-evaluated:Patient Re-evaluated prior to inductionOxygen Delivery Method: Circle system utilized Preoxygenation: Pre-oxygenation with 100% oxygen Intubation Type: IV induction Ventilation: Mask ventilation without difficulty Grade View: Grade I Tube type: Oral Tube size: 8.0 mm Number of attempts: 1 Airway Equipment and Method: Oral airway Placement Confirmation: ETT inserted through vocal cords under direct vision,  positive ETCO2 and breath sounds checked- equal and bilateral Secured at: 22 cm Tube secured with: Tape Dental Injury: Teeth and Oropharynx as per pre-operative assessment

## 2015-12-22 NOTE — Discharge Instructions (Signed)

## 2015-12-22 NOTE — Anesthesia Preprocedure Evaluation (Signed)
Anesthesia Evaluation  Patient identified by MRN, date of birth, ID band Patient awake    Reviewed: Allergy & Precautions, NPO status , Patient's Chart, lab work & pertinent test results  Airway Mallampati: I  TM Distance: >3 FB     Dental   Pulmonary former smoker,    Pulmonary exam normal        Cardiovascular Normal cardiovascular exam     Neuro/Psych  Headaches,    GI/Hepatic   Endo/Other    Renal/GU      Musculoskeletal  (+) Arthritis ,   Abdominal   Peds  Hematology   Anesthesia Other Findings Hx lymphoma Prostate CA  Reproductive/Obstetrics                             Anesthesia Physical Anesthesia Plan  ASA: II  Anesthesia Plan: General   Post-op Pain Management:    Induction: Intravenous  Airway Management Planned: Oral ETT  Additional Equipment:   Intra-op Plan:   Post-operative Plan: Extubation in OR  Informed Consent: I have reviewed the patients History and Physical, chart, labs and discussed the procedure including the risks, benefits and alternatives for the proposed anesthesia with the patient or authorized representative who has indicated his/her understanding and acceptance.     Plan Discussed with: CRNA, Anesthesiologist and Surgeon  Anesthesia Plan Comments:         Anesthesia Quick Evaluation

## 2015-12-23 ENCOUNTER — Encounter (HOSPITAL_COMMUNITY): Payer: Self-pay | Admitting: *Deleted

## 2015-12-23 LAB — HEMOGLOBIN AND HEMATOCRIT, BLOOD
HCT: 34.1 % — ABNORMAL LOW (ref 39.0–52.0)
Hemoglobin: 11.5 g/dL — ABNORMAL LOW (ref 13.0–17.0)

## 2015-12-23 MED ORDER — BISACODYL 10 MG RE SUPP
10.0000 mg | Freq: Once | RECTAL | Status: AC
  Administered 2015-12-23: 10 mg via RECTAL
  Filled 2015-12-23 (×2): qty 1

## 2015-12-23 MED ORDER — HYDROCODONE-ACETAMINOPHEN 5-325 MG PO TABS: 1.0000 | ORAL_TABLET | ORAL | Status: DC | PRN

## 2015-12-23 NOTE — Progress Notes (Signed)
1 Day Post-Op Subjective: The patient is doing well.  No nausea or vomiting. Pain is adequately controlled. Foley draining well. Ambulated last night. Hb 11.5 from 12.8 this AM. Excellent UOP (1.6L) with only 34ml from drain overnight.  Objective: Vital signs in last 24 hours: Temp:  [97.6 F (36.4 C)-99 F (37.2 C)] 99 F (37.2 C) (07/14 [id number]) Pulse Rate:  [65-81] 75 (07/14 0613) Resp:  [10-17] 16 (07/14 0613) BP: (106-145)/(56-79) 106/56 mmHg (07/14 0613) SpO2:  [95 %-100 %] 98 % (07/14 0613) Weight:  [103.42 kg (228 lb)] 103.42 kg (228 lb) (07/13 1035)  Intake/Output from previous day: 07/13 0701 - 07/14 0700 In: 4787.5 [P.O.:480; I.V.:3257.5; IV Piggyback:1050] Out: 2210 [Urine:1680; Drains:230; Blood:300] Intake/Output this shift: Total I/O In: 1187.5 [P.O.:480; I.V.:657.5; IV Piggyback:50] Out: [id number] [Urine:1600; Drains:90]  Physical Exam:  General: Alert and oriented. CV: RRR Lungs: Clear bilaterally. GI: Soft, Nondistended. Incisions: Clean, dry, and intact Urine: Clear Extremities: Nontender, no erythema, no edema.  Lab Results:  Recent Labs  12/22/15 1809 12/23/15 0431  HGB 12.8* 11.5*  HCT 39.0 34.1*      Assessment/Plan: POD# 1 s/p robotic prostatectomy.  1) SL IVF 2) Ambulate, Incentive spirometry 3) Transition to oral pain medication 4) Dulcolax suppository 5) D/C pelvic drain 6) Plan for likely discharge later today    LOS: 1 day   Lolita Rieger 12/23/2015, 6:31 AM

## 2016-10-15 ENCOUNTER — Other Ambulatory Visit: Payer: Self-pay | Admitting: Surgery

## 2016-10-15 DIAGNOSIS — N62 Hypertrophy of breast: Secondary | ICD-10-CM

## 2016-10-17 ENCOUNTER — Other Ambulatory Visit: Payer: Self-pay | Admitting: Surgery

## 2016-11-01 ENCOUNTER — Other Ambulatory Visit: Payer: Self-pay | Admitting: Surgery

## 2016-11-01 DIAGNOSIS — N62 Hypertrophy of breast: Secondary | ICD-10-CM

## 2016-12-13 ENCOUNTER — Ambulatory Visit: Payer: No Typology Code available for payment source

## 2016-12-13 ENCOUNTER — Ambulatory Visit
Admission: RE | Admit: 2016-12-13 | Discharge: 2016-12-13 | Disposition: A | Discharge status: Home / Self Care | Payer: No Typology Code available for payment source | Source: Ambulatory Visit | Attending: Surgery | Admitting: Surgery

## 2016-12-13 DIAGNOSIS — N62 Hypertrophy of breast: Secondary | ICD-10-CM

## 2017-07-12 DIAGNOSIS — Z96642 Presence of left artificial hip joint: Secondary | ICD-10-CM | POA: Diagnosis not present

## 2017-07-12 DIAGNOSIS — M25551 Pain in right hip: Secondary | ICD-10-CM | POA: Diagnosis not present

## 2017-07-24 DIAGNOSIS — M25551 Pain in right hip: Secondary | ICD-10-CM | POA: Diagnosis not present

## 2017-08-02 DIAGNOSIS — M1611 Unilateral primary osteoarthritis, right hip: Secondary | ICD-10-CM | POA: Diagnosis not present

## 2017-08-07 DIAGNOSIS — M1611 Unilateral primary osteoarthritis, right hip: Secondary | ICD-10-CM | POA: Diagnosis not present

## 2017-09-20 DIAGNOSIS — M1611 Unilateral primary osteoarthritis, right hip: Secondary | ICD-10-CM | POA: Diagnosis not present

## 2017-10-03 DIAGNOSIS — Z8579 Personal history of other malignant neoplasms of lymphoid, hematopoietic and related tissues: Secondary | ICD-10-CM | POA: Diagnosis not present

## 2017-10-03 DIAGNOSIS — G4700 Insomnia, unspecified: Secondary | ICD-10-CM | POA: Diagnosis not present

## 2017-10-03 DIAGNOSIS — Z23 Encounter for immunization: Secondary | ICD-10-CM | POA: Diagnosis not present

## 2017-10-03 DIAGNOSIS — E782 Mixed hyperlipidemia: Secondary | ICD-10-CM | POA: Diagnosis not present

## 2017-10-03 DIAGNOSIS — M25551 Pain in right hip: Secondary | ICD-10-CM | POA: Diagnosis not present

## 2017-10-03 DIAGNOSIS — Z8546 Personal history of malignant neoplasm of prostate: Secondary | ICD-10-CM | POA: Diagnosis not present

## 2017-10-03 DIAGNOSIS — R7301 Impaired fasting glucose: Secondary | ICD-10-CM | POA: Diagnosis not present

## 2017-10-03 DIAGNOSIS — Z86010 Personal history of colonic polyps: Secondary | ICD-10-CM | POA: Diagnosis not present

## 2017-10-03 DIAGNOSIS — Z125 Encounter for screening for malignant neoplasm of prostate: Secondary | ICD-10-CM | POA: Diagnosis not present

## 2017-10-03 DIAGNOSIS — Z0000 Encounter for general adult medical examination without abnormal findings: Secondary | ICD-10-CM | POA: Diagnosis not present

## 2017-10-03 DIAGNOSIS — Z79899 Other long term (current) drug therapy: Secondary | ICD-10-CM | POA: Diagnosis not present

## 2017-10-03 DIAGNOSIS — Z87442 Personal history of urinary calculi: Secondary | ICD-10-CM | POA: Diagnosis not present

## 2017-10-10 DIAGNOSIS — Z23 Encounter for immunization: Secondary | ICD-10-CM | POA: Diagnosis not present

## 2017-10-22 NOTE — Patient Instructions (Addendum)
Brevan Luberto Seppala  10/22/2017   Your procedure is scheduled on: Wednesday 10/30/2017    Report to [facility] Main  Entrance              Report to admitting at  1030 AM    Call this number if you have problems the morning of surgery [phone]    Remember: Do not eat food :After Midnight.  May have clear liquids from midnight up until 0700 am then nothing until after surgery!     CLEAR LIQUID DIET   Foods Allowed                                                                     Foods Excluded  Coffee and tea, regular and decaf                             liquids that you cannot  Plain Jell-O in any flavor                                             see through such as: Fruit ices (not with fruit pulp)                                     milk, soups, orange juice  Iced Popsicles                                    All solid food Carbonated beverages, regular and diet                                    Cranberry, grape and apple juices Sports drinks like Gatorade Lightly seasoned clear broth or consume(fat free) Sugar, honey syrup  Sample Menu Breakfast                                Lunch                                     Supper Cranberry juice                    Beef broth                            Chicken broth Jell-O                                     Grape juice  Apple juice Coffee or tea                        Jell-O                                      Popsicle                                                Coffee or tea                        Coffee or tea  _____________________________________________________________________     Take these medicines the morning of surgery with A SIP OF WATER: Atorvastatin (Lipitor), Certirizine (Zyrtec), use Flonase nasal spray                                You may not have any metal on your body including hair pins and              piercings  Do not wear jewelry, make-up,  lotions, powders or perfumes, deodorant               Men may shave face and neck.   Do not bring valuables to the hospital. [place].  Contacts, dentures or bridgework may not be worn into surgery.  Leave suitcase in the car. After surgery it may be brought to your room.                  Please read over the following fact sheets you were given: _____________________________________________________________________             [facility] - Preparing for Surgery Before surgery, you can play an important role.  Because skin is not sterile, your skin needs to be as free of germs as possible.  You can reduce the number of germs on your skin by washing with CHG (chlorahexidine gluconate) soap before surgery.  CHG is an antiseptic cleaner which kills germs and bonds with the skin to continue killing germs even after washing. Please DO NOT use if you have an allergy to CHG or antibacterial soaps.  If your skin becomes reddened/irritated stop using the CHG and inform your nurse when you arrive at Short Stay. Do not shave (including legs and underarms) for at least 48 hours prior to the first CHG shower.  You may shave your face/neck. Please follow these instructions carefully:  1.  Shower with CHG Soap the night before surgery and the  morning of Surgery.  2.  If you choose to wash your hair, wash your hair first as usual with your  normal  shampoo.  3.  After you shampoo, rinse your hair and body thoroughly to remove the  shampoo.                           4.  Use CHG as you would any other liquid soap.  You can apply chg directly  to the skin and wash  Gently with a scrungie or clean washcloth.  5.  Apply the CHG Soap to your body ONLY FROM THE NECK DOWN.   Do not use on face/ open                           Wound or open sores. Avoid contact with eyes, ears mouth and genitals (private parts).                       Wash face,   Genitals (private parts) with your normal soap.             6.  Wash thoroughly, paying special attention to the area where your surgery  will be performed.  7.  Thoroughly rinse your body with warm water from the neck down.  8.  DO NOT shower/wash with your normal soap after using and rinsing off  the CHG Soap.                9.  Pat yourself dry with a clean towel.            10.  Wear clean pajamas.            11.  Place clean sheets on your bed the night of your first shower and do not  sleep with pets. Day of Surgery : Do not apply any lotions/deodorants the morning of surgery.  Please wear clean clothes to the hospital/surgery center.  FAILURE TO FOLLOW THESE INSTRUCTIONS MAY RESULT IN THE CANCELLATION OF YOUR SURGERY PATIENT SIGNATURE_________________________________  NURSE SIGNATURE__________________________________  ________________________________________________________________________   [name]  An incentive spirometer is a tool that can help keep your lungs clear and active. This tool measures how well you are filling your lungs with each breath. Taking long deep breaths may help reverse or decrease the chance of developing breathing (pulmonary) problems (especially infection) following:  A long period of time when you are unable to move or be active. BEFORE THE PROCEDURE   If the spirometer includes an indicator to show your best effort, your nurse or respiratory therapist will set it to a desired goal.  If possible, sit up straight or lean slightly forward. Try not to slouch.  Hold the incentive spirometer in an upright position. INSTRUCTIONS FOR USE  1. Sit on the edge of your bed if possible, or sit up as far as you can in bed or on a chair. 2. Hold the incentive spirometer in an upright position. 3. Breathe out normally. 4. Place the mouthpiece in your mouth and seal your lips tightly around it. 5. Breathe in slowly and as deeply as possible, raising  the piston or the ball toward the top of the column. 6. Hold your breath for 3-5 seconds or for as long as possible. Allow the piston or ball to fall to the bottom of the column. 7. Remove the mouthpiece from your mouth and breathe out normally. 8. Rest for a few seconds and repeat Steps 1 through 7 at least 10 times every 1-2 hours when you are awake. Take your time and take a few normal breaths between deep breaths. 9. The spirometer may include an indicator to show your best effort. Use the indicator as a goal to work toward during each repetition. 10. After each set of 10 deep breaths, practice coughing to be sure your lungs are clear. If you have an incision (the cut made at the time of  surgery), support your incision when coughing by placing a pillow or rolled up towels firmly against it. Once you are able to get out of bed, walk around indoors and cough well. You may stop using the incentive spirometer when instructed by your caregiver.  RISKS AND COMPLICATIONS  Take your time so you do not get dizzy or light-headed.  If you are in pain, you may need to take or ask for pain medication before doing incentive spirometry. It is harder to take a deep breath if you are having pain. AFTER USE  Rest and breathe slowly and easily.  It can be helpful to keep track of a log of your progress. Your caregiver can provide you with a simple table to help with this. If you are using the spirometer at home, follow these instructions: [place] IF:   You are having difficultly using the spirometer.  You have trouble using the spirometer as often as instructed.  Your pain medication is not giving enough relief while using the spirometer.  You develop fever of 100.5 F (38.1 C) or higher. SEEK IMMEDIATE MEDICAL CARE IF:   You cough up bloody sputum that had not been present before.  You develop fever of 102 F (38.9 C) or greater.  You develop worsening pain at or near the incision  site. MAKE SURE YOU:   Understand these instructions.  Will watch your condition.  Will get help right away if you are not doing well or get worse. Document Released: 10/08/2006 Document Revised: 08/20/2011 Document Reviewed: 12/09/2006 Exit[employer] Patient Information 2014 Exit[employer], [place].   ________________________________________________________________________  WHAT IS A BLOOD TRANSFUSION? Blood Transfusion Information  A transfusion is the replacement of blood or some of its parts. Blood is made up of multiple cells which provide different functions.  Red blood cells carry oxygen and are used for blood loss replacement.  White blood cells fight against infection.  Platelets control bleeding.  Plasma helps clot blood.  Other blood products are available for specialized needs, such as hemophilia or other clotting disorders. BEFORE THE TRANSFUSION  Who gives blood for transfusions?   Healthy volunteers who are fully evaluated to make sure their blood is safe. This is blood bank blood. Transfusion therapy is the safest it has ever been in the practice of medicine. Before blood is taken from a donor, a complete history is taken to make sure that person has no history of diseases nor engages in risky social behavior (examples are intravenous drug use or sexual activity with multiple partners). The donor's travel history is screened to minimize risk of transmitting infections, such as malaria. The donated blood is tested for signs of infectious diseases, such as HIV and hepatitis. The blood is then tested to be sure it is compatible with you in order to minimize the chance of a transfusion reaction. If you or a relative donates blood, this is often done in anticipation of surgery and is not appropriate for emergency situations. It takes many days to process the donated blood. RISKS AND COMPLICATIONS Although transfusion therapy is very safe and saves many lives, the main dangers of  transfusion include:   Getting an infectious disease.  Developing a transfusion reaction. This is an allergic reaction to something in the blood you were given. Every precaution is taken to prevent this. The decision to have a blood transfusion has been considered carefully by your caregiver before blood is given. Blood is not given unless the benefits outweigh the risks. AFTER THE  TRANSFUSION  Right after receiving a blood transfusion, you will usually feel much better and more energetic. This is especially true if your red blood cells have gotten low (anemic). The transfusion raises the level of the red blood cells which carry oxygen, and this usually causes an energy increase.  The nurse administering the transfusion will monitor you carefully for complications. HOME CARE INSTRUCTIONS  No special instructions are needed after a transfusion. You may find your energy is better. Speak with your caregiver about any limitations on activity for underlying diseases you may have. SEEK MEDICAL CARE IF:   Your condition is not improving after your transfusion.  You develop redness or irritation at the intravenous (IV) site. SEEK IMMEDIATE MEDICAL CARE IF:  Any of the following symptoms occur over the next 12 hours:  Shaking chills.  You have a temperature by mouth above 102 F (38.9 C), not controlled by medicine.  Chest, back, or muscle pain.  People around you feel you are not acting correctly or are confused.  Shortness of breath or difficulty breathing.  Dizziness and fainting.  You get a rash or develop hives.  You have a decrease in urine output.  Your urine turns a dark color or changes to pink, red, or [place]. Any of the following symptoms occur over the next 10 days:  You have a temperature by mouth above 102 F (38.9 C), not controlled by medicine.  Shortness of breath.  Weakness after normal activity.  The white part of the eye turns yellow (jaundice).  You have a  decrease in the amount of urine or are urinating less often.  Your urine turns a dark color or changes to pink, red, or [place]. Document Released: 05/25/2000 Document Revised: 08/20/2011 Document Reviewed: 01/12/2008 [facility] Patient Information 2014 [place], [place].  _______________________________________________________________________

## 2017-10-23 ENCOUNTER — Encounter (HOSPITAL_COMMUNITY)
Admission: RE | Admit: 2017-10-23 | Discharge: 2017-10-23 | Disposition: A | Discharge status: Home / Self Care | Payer: Medicare Other | Source: Ambulatory Visit | Attending: Orthopedic Surgery | Admitting: Orthopedic Surgery

## 2017-10-23 ENCOUNTER — Other Ambulatory Visit: Payer: Self-pay

## 2017-10-23 ENCOUNTER — Encounter (HOSPITAL_COMMUNITY): Payer: Self-pay

## 2017-10-23 DIAGNOSIS — Z01812 Encounter for preprocedural laboratory examination: Secondary | ICD-10-CM | POA: Insufficient documentation

## 2017-10-23 DIAGNOSIS — M1611 Unilateral primary osteoarthritis, right hip: Secondary | ICD-10-CM | POA: Insufficient documentation

## 2017-10-23 LAB — CBC
HCT: 41.8 % (ref 39.0–52.0)
Hemoglobin: 14.1 g/dL (ref 13.0–17.0)
MCH: 31.8 pg (ref 26.0–34.0)
MCHC: 33.7 g/dL (ref 30.0–36.0)
MCV: 94.4 fL (ref 78.0–100.0)
Platelets: 307 10*3/uL (ref 150–400)
RBC: 4.43 MIL/uL (ref 4.22–5.81)
RDW: 12.9 % (ref 11.5–15.5)
WBC: 5.3 10*3/uL (ref 4.0–10.5)

## 2017-10-23 LAB — URINALYSIS, ROUTINE W REFLEX MICROSCOPIC
Bilirubin Urine: NEGATIVE
Glucose, UA: NEGATIVE mg/dL
Hgb urine dipstick: NEGATIVE
Ketones, ur: NEGATIVE mg/dL
Leukocytes, UA: NEGATIVE
Nitrite: NEGATIVE
Protein, ur: NEGATIVE mg/dL
Specific Gravity, Urine: 1.021 (ref 1.005–1.030)
pH: 6 (ref 5.0–8.0)

## 2017-10-23 LAB — COMPREHENSIVE METABOLIC PANEL
ALT: 23 U/L (ref 17–63)
AST: 23 U/L (ref 15–41)
Albumin: 4.1 g/dL (ref 3.5–5.0)
Alkaline Phosphatase: 55 U/L (ref 38–126)
Anion gap: 9 (ref 5–15)
BUN: 18 mg/dL (ref 6–20)
CO2: 24 mmol/L (ref 22–32)
Calcium: 9.3 mg/dL (ref 8.9–10.3)
Chloride: 107 mmol/L (ref 101–111)
Creatinine, Ser: 0.84 mg/dL (ref 0.61–1.24)
GFR calc Af Amer: >60 mL/min (ref >60)
GFR calc non Af Amer: >60 mL/min (ref >60)
Glucose, Bld: 111 mg/dL — ABNORMAL HIGH (ref 65–99)
Potassium: 4.5 mmol/L (ref 3.5–5.1)
Sodium: 140 mmol/L (ref 135–145)
Total Bilirubin: 0.6 mg/dL (ref 0.3–1.2)
Total Protein: 7.2 g/dL (ref 6.5–8.1)

## 2017-10-23 LAB — SURGICAL PCR SCREEN
MRSA, PCR: NEGATIVE
STAPHYLOCOCCUS AUREUS: NEGATIVE

## 2017-10-23 LAB — APTT: aPTT: 30 seconds (ref 24–36)

## 2017-10-23 LAB — PROTIME-INR
INR: 0.98
Prothrombin Time: 12.9 seconds (ref 11.4–15.2)

## 2017-10-23 NOTE — Progress Notes (Signed)
CHART LEFT WITH MISTY  TO CHECK MRSA PCR

## 2017-10-29 ENCOUNTER — Other Ambulatory Visit: Payer: Self-pay | Admitting: Orthopedic Surgery

## 2017-10-29 MED ORDER — TRANEXAMIC ACID 1000 MG/10ML IV SOLN
1000.0000 mg | INTRAVENOUS | Status: AC
  Administered 2017-10-30: 1000 mg via INTRAVENOUS
  Filled 2017-10-29: qty 1100

## 2017-10-29 NOTE — H&P (Signed)
TOTAL HIP ADMISSION H&P  Patient is admitted for right total hip arthroplasty.  Subjective:  Chief Complaint: right hip pain  HPI: Ian Park, 67 y.o. male, has a history of pain and functional disability in the right hip(s) due to arthritis and patient has failed non-surgical conservative treatments for greater than 12 weeks to include NSAID's and/or analgesics, corticosteriod injections, flexibility and strengthening excercises and activity modification.  Onset of symptoms was gradual starting 3 years ago with gradually worsening course since that time.The patient noted no past surgery on the right hip(s).  Patient currently rates pain in the right hip at 7 out of 10 with activity. Patient has night pain, worsening of pain with activity and weight bearing, pain that interfers with activities of daily living and pain with passive range of motion. Patient has evidence of periarticular osteophytes and joint space narrowing by imaging studies. This condition presents safety issues increasing the risk of falls. There is no current active infection.  Patient Active Problem List   Diagnosis Date Noted  . Prostate cancer ([place]) 12/22/2015  . OA (osteoarthritis) of hip 05/30/2015  . Lymphoma ([place]) 12/12/2011   Past Medical History:  Diagnosis Date  . Arthritis    oa, hips and right shoulder  . Birthmark    right leg portwine stain birthmark  . Cancer ([place])    lymphoma- tx. last with chemo 2011-currently remission-Dr. Keene Breath  . Complication of anesthesia    "slow to wake up and PEE"  . Headache    occ. stress related  . History of chemotherapy 2011   none currently  . History of kidney stones    x1 episode. Bladder stone at present  . Hx of seasonal allergies   . Hypercholesterolemia   . 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 Prostate cancer ([place])   . Shin injury    red area no drainage pt says present for last 30 years    Past Surgical History:  Procedure  Laterality Date  . KNEE ARTHROSCOPY  1982   Right knee  . LYMPHADENECTOMY Bilateral 12/22/2015   Procedure: BILATERAL PELVIC LYMPHADENECTOMY;  Surgeon: Raynelle Bring, MD;  Location: [facility];  Service: Urology;  Laterality: Bilateral;  . ROBOT ASSISTED LAPAROSCOPIC RADICAL PROSTATECTOMY N/A 12/22/2015   Procedure: XI ROBOTIC ASSISTED LAPAROSCOPIC RADICAL PROSTATECTOMY LEVEL 3, AND REMOVAL OF BLADDER CALCULUS;  Surgeon: Raynelle Bring, MD;  Location: [facility];  Service: Urology;  Laterality: N/A;  . ROTATOR CUFF REPAIR  1997   Right shoulder   . SHOULDER ARTHROSCOPY  1986   Right shoulder  . SHOULDER ARTHROSCOPY  1993   Left shoulder Rotator cuff repair  . TOTAL HIP ARTHROPLASTY Left 05/30/2015   Procedure: LEFT TOTAL HIP ARTHROPLASTY ANTERIOR APPROACH;  Surgeon: Gaynelle Arabian, MD;  Location: [facility];  Service: Orthopedics;  Laterality: Left;        Current Outpatient Medications  Medication Sig Dispense Refill Last Dose  . acetaminophen (TYLENOL) 500 MG tablet Take 1,000 mg by mouth every three (3) days as needed for moderate pain.     [name] ALPRAZolam (XANAX) 0.25 MG tablet Take 0.25 mg by mouth at bedtime as needed for sleep.    12/20/2015  . atorvastatin (LIPITOR) 20 MG tablet Take 20 mg by mouth daily.     12/21/2015 at Unknown time  . cetirizine (ZYRTEC) 10 MG tablet Take 10 mg by mouth daily.     12/21/2015 at Unknown time  . Cholecalciferol (VITAMIN D) 2000 units CAPS  Take 2,000 Units by mouth 2 (two) times daily.     . diphenhydrAMINE (BENADRYL) 25 MG tablet Take 25 mg by mouth at bedtime.    12/21/2015 at Unknown time  . fluticasone (FLONASE) 50 MCG/ACT nasal spray Place 2 sprays into both nostrils daily.     [name] ibuprofen (ADVIL,MOTRIN) 200 MG tablet Take 400 mg by mouth 2 (two) times daily as needed.     . Melatonin 5 MG TABS Take 5 mg by mouth at bedtime.     . Multiple Vitamin (MULTIVITAMIN) tablet Take 0.5 tablets by mouth 2 (two) times daily.     . traMADol (ULTRAM) 50 MG tablet Take 50 mg  by mouth every other day.     . vitamin B-12 (CYANOCOBALAMIN) 500 MCG tablet Take 500 mcg by mouth daily.     [name] HYDROcodone-acetaminophen (NORCO) 5-325 MG tablet Take 1-2 tablets by mouth every 6 (six) hours as needed for moderate pain. 30 tablet 0   . sulfamethoxazole-trimethoprim (BACTRIM DS,SEPTRA DS) 800-160 MG tablet Take 1 tablet by mouth 2 (two) times daily. Start the day prior to foley removal appointment 6 tablet 0    No Known Allergies  Social History   Tobacco Use  . Smoking status: Former Smoker    Packs/day: 1.00    Years: 10.00    Pack years: 10.00    Types: Cigars, Pipe    Last attempt to quit: 06/11/2004    Years since quitting: 13.3  . Smokeless tobacco: Never Used  Substance Use Topics  . Alcohol use: Yes    Alcohol/week: 3.0 oz    Types: 5 Shots of liquor per week    Comment: 1 drink per day    Family History  Problem Relation Age of Onset  . Cataracts Mother   . Heart attack Father   . Cholelithiasis Brother      Review of Systems  Constitutional: Negative.   HENT: Negative.   Eyes: Negative.   Respiratory: Negative.   Cardiovascular: Negative.   Gastrointestinal: Negative.   Genitourinary: Negative.   Musculoskeletal: Positive for joint pain and myalgias. Negative for back pain, falls and neck pain.  Skin: Negative.   Neurological: Negative.   Endo/Heme/Allergies: Negative.   Psychiatric/Behavioral: Negative.     Objective:  Physical Exam  Constitutional: He is oriented to person, place, and time. He appears well-developed. No distress.  Obese  HENT:  Head: Normocephalic and atraumatic.  Right Ear: External ear normal.  Left Ear: External ear normal.  Nose: Nose normal.  Mouth/Throat: Oropharynx is clear and moist.  Eyes: Conjunctivae and EOM are normal.  Neck: Normal range of motion. Neck supple.  Cardiovascular: Normal rate, regular rhythm, normal heart sounds and intact distal pulses.  No murmur heard. Respiratory: Effort normal and  breath sounds normal. No respiratory distress. He has no wheezes.  GI: Soft. Bowel sounds are normal. He exhibits no distension. There is no tenderness.  Musculoskeletal:  Flexion to 110 degrees, internal rotation 10 degrees, external rotation 20 degrees, and abduction 30 degrees with pain.  There is no tenderness over the greater trochanter.  There is pain on provocative testing of the hip.  Neurological: He is alert and oriented to person, place, and time. He has normal strength. No sensory deficit.  Skin: No rash noted. He is not diaphoretic. No erythema.  Psychiatric: He has a normal mood and affect. His behavior is normal.    Vital Signs  Ht: 5 ft 7 in  Wt: 220 lbs  BMI: 34.5  BP: 128/74 sitting L arm  Pulse: 66 bpm   Estimated body mass index is 34.61 kg/m as calculated from the following:   Height as of 10/23/17: 5\' 7"  (1.702 m).   Weight as of 10/23/17: 100.2 kg (221 lb).   Imaging Review Plain radiographs demonstrate severe degenerative joint disease of the right hip(s). The bone quality appears to be good for age and reported activity level.    Preoperative templating of the joint replacement has been completed, documented, and submitted to the Operating Room personnel in order to optimize intra-operative equipment management.     Assessment/Plan:  End stage primary osteoarthritis, right hip(s)  The patient history, physical examination, clinical judgement of the provider and imaging studies are consistent with end stage degenerative joint disease of the right hip(s) and total hip arthroplasty is deemed medically necessary. The treatment options including medical management, injection therapy, arthroscopy and arthroplasty were discussed at length. The risks and benefits of total hip arthroplasty were presented and reviewed. The risks due to aseptic loosening, infection, stiffness, dislocation/subluxation,  thromboembolic complications and other imponderables were  discussed.  The patient acknowledged the explanation, agreed to proceed with the plan and consent was signed. Patient is being admitted for inpatient treatment for surgery, pain control, PT, OT, prophylactic antibiotics, VTE prophylaxis, progressive ambulation and ADL's and discharge planning.The patient is planning to be discharged home with HEP.   Therapy Plans: HEP Disposition: Home with son Planned DVT prophylaxis: aspirin 325mg  BID DME needed: walker, 3-n-1 PCP: Dr. [name] Other: TXA IV  Ardeen Jourdain, PA-C

## 2017-10-29 NOTE — Care Plan (Signed)
Ortho Bundle L THA scheduled 10-30-17 DCP:  Home with son.  2 story home with 6 ste.   DME:  Rx for RW and 3-in-1 given prior to surgery PT:  HEP

## 2017-10-30 ENCOUNTER — Inpatient Hospital Stay (HOSPITAL_COMMUNITY): Payer: Medicare Other | Admitting: Registered Nurse

## 2017-10-30 ENCOUNTER — Encounter (HOSPITAL_COMMUNITY)
Admission: RE | Disposition: A | Discharge status: Home / Self Care | Payer: Self-pay | Source: Ambulatory Visit | Attending: Orthopedic Surgery

## 2017-10-30 ENCOUNTER — Encounter (HOSPITAL_COMMUNITY): Payer: Self-pay | Admitting: *Deleted

## 2017-10-30 ENCOUNTER — Inpatient Hospital Stay (HOSPITAL_COMMUNITY): Payer: Medicare Other

## 2017-10-30 ENCOUNTER — Inpatient Hospital Stay (HOSPITAL_COMMUNITY)
Admission: RE | Admit: 2017-10-30 | Discharge: 2017-10-31 | DRG: 470 | Disposition: A | Discharge status: Home / Self Care | Payer: Medicare Other | Source: Ambulatory Visit | Attending: Orthopedic Surgery | Admitting: Orthopedic Surgery

## 2017-10-30 ENCOUNTER — Other Ambulatory Visit: Payer: Self-pay

## 2017-10-30 DIAGNOSIS — E669 Obesity, unspecified: Secondary | ICD-10-CM | POA: Diagnosis present

## 2017-10-30 DIAGNOSIS — Z96649 Presence of unspecified artificial hip joint: Secondary | ICD-10-CM

## 2017-10-30 DIAGNOSIS — Z9079 Acquired absence of other genital organ(s): Secondary | ICD-10-CM

## 2017-10-30 DIAGNOSIS — Z471 Aftercare following joint replacement surgery: Secondary | ICD-10-CM | POA: Diagnosis not present

## 2017-10-30 DIAGNOSIS — Z6834 Body mass index (BMI) 34.0-34.9, adult: Secondary | ICD-10-CM | POA: Diagnosis not present

## 2017-10-30 DIAGNOSIS — Z7951 Long term (current) use of inhaled steroids: Secondary | ICD-10-CM

## 2017-10-30 DIAGNOSIS — Z8572 Personal history of non-Hodgkin lymphomas: Secondary | ICD-10-CM | POA: Diagnosis not present

## 2017-10-30 DIAGNOSIS — Z87891 Personal history of nicotine dependence: Secondary | ICD-10-CM

## 2017-10-30 DIAGNOSIS — J302 Other seasonal allergic rhinitis: Secondary | ICD-10-CM | POA: Diagnosis present

## 2017-10-30 DIAGNOSIS — Z96642 Presence of left artificial hip joint: Secondary | ICD-10-CM | POA: Diagnosis present

## 2017-10-30 DIAGNOSIS — Z9221 Personal history of antineoplastic chemotherapy: Secondary | ICD-10-CM | POA: Diagnosis not present

## 2017-10-30 DIAGNOSIS — Z8546 Personal history of malignant neoplasm of prostate: Secondary | ICD-10-CM

## 2017-10-30 DIAGNOSIS — E7800 Pure hypercholesterolemia, unspecified: Secondary | ICD-10-CM | POA: Diagnosis present

## 2017-10-30 DIAGNOSIS — Z96641 Presence of right artificial hip joint: Secondary | ICD-10-CM | POA: Diagnosis not present

## 2017-10-30 DIAGNOSIS — Z87442 Personal history of urinary calculi: Secondary | ICD-10-CM

## 2017-10-30 DIAGNOSIS — M19011 Primary osteoarthritis, right shoulder: Secondary | ICD-10-CM | POA: Diagnosis not present

## 2017-10-30 DIAGNOSIS — Q825 Congenital non-neoplastic nevus: Secondary | ICD-10-CM | POA: Diagnosis not present

## 2017-10-30 DIAGNOSIS — M169 Osteoarthritis of hip, unspecified: Secondary | ICD-10-CM | POA: Diagnosis present

## 2017-10-30 DIAGNOSIS — Z8249 Family history of ischemic heart disease and other diseases of the circulatory system: Secondary | ICD-10-CM

## 2017-10-30 DIAGNOSIS — M1611 Unilateral primary osteoarthritis, right hip: Principal | ICD-10-CM | POA: Diagnosis present

## 2017-10-30 DIAGNOSIS — M25551 Pain in right hip: Secondary | ICD-10-CM | POA: Diagnosis not present

## 2017-10-30 HISTORY — PX: TOTAL HIP ARTHROPLASTY: SHX124

## 2017-10-30 LAB — TYPE AND SCREEN
ABO/RH(D): A POS
Antibody Screen: NEGATIVE

## 2017-10-30 SURGERY — ARTHROPLASTY, HIP, TOTAL, ANTERIOR APPROACH
Anesthesia: Spinal | Site: Hip | Laterality: Right

## 2017-10-30 MED ORDER — METHOCARBAMOL 1000 MG/10ML IJ SOLN
500.0000 mg | Freq: Four times a day (QID) | INTRAVENOUS | Status: DC | PRN
  Filled 2017-10-30: qty 5

## 2017-10-30 MED ORDER — POLYETHYLENE GLYCOL 3350 17 G PO PACK: 17.0000 g | PACK | Freq: Every day | ORAL | Status: DC | PRN

## 2017-10-30 MED ORDER — CEFAZOLIN SODIUM-DEXTROSE 2-4 GM/100ML-% IV SOLN
2.0000 g | INTRAVENOUS | Status: AC
  Administered 2017-10-30: 2 g via INTRAVENOUS
  Filled 2017-10-30: qty 100

## 2017-10-30 MED ORDER — METOCLOPRAMIDE HCL 5 MG/ML IJ SOLN: 5.0000 mg | Freq: Three times a day (TID) | INTRAMUSCULAR | Status: DC | PRN

## 2017-10-30 MED ORDER — FENTANYL CITRATE (PF) 100 MCG/2ML IJ SOLN
25.0000 ug | INTRAMUSCULAR | Status: DC | PRN
Start: 2017-10-30 — End: 2017-10-30
  Administered 2017-10-30 (×2): 50 ug via INTRAVENOUS

## 2017-10-30 MED ORDER — METOCLOPRAMIDE HCL 5 MG PO TABS: 5.0000 mg | ORAL_TABLET | Freq: Three times a day (TID) | ORAL | Status: DC | PRN

## 2017-10-30 MED ORDER — FLUTICASONE PROPIONATE 50 MCG/ACT NA SUSP
2.0000 | Freq: Every day | NASAL | Status: DC
  Administered 2017-10-31: 2 via NASAL
  Filled 2017-10-30: qty 16

## 2017-10-30 MED ORDER — PHENYLEPHRINE HCL 10 MG/ML IJ SOLN
INTRAVENOUS | Status: DC | PRN
  Administered 2017-10-30: 25 ug/min via INTRAVENOUS

## 2017-10-30 MED ORDER — METOCLOPRAMIDE HCL 5 MG/ML IJ SOLN: 10.0000 mg | Freq: Once | INTRAMUSCULAR | Status: DC | PRN

## 2017-10-30 MED ORDER — DEXAMETHASONE SODIUM PHOSPHATE 10 MG/ML IJ SOLN
10.0000 mg | Freq: Once | INTRAMUSCULAR | Status: AC
  Administered 2017-10-31: 10 mg via INTRAVENOUS
  Filled 2017-10-30: qty 1

## 2017-10-30 MED ORDER — OXYCODONE HCL 5 MG PO TABS
5.0000 mg | ORAL_TABLET | ORAL | Status: DC | PRN
  Administered 2017-10-30 – 2017-10-31 (×3): 10 mg via ORAL
  Filled 2017-10-30 (×2): qty 2

## 2017-10-30 MED ORDER — FENTANYL CITRATE (PF) 100 MCG/2ML IJ SOLN
INTRAMUSCULAR | Status: AC
  Filled 2017-10-30: qty 2

## 2017-10-30 MED ORDER — FLEET ENEMA 7-19 GM/118ML RE ENEM: 1.0000 | ENEMA | Freq: Once | RECTAL | Status: DC | PRN

## 2017-10-30 MED ORDER — TRAMADOL HCL 50 MG PO TABS
50.0000 mg | ORAL_TABLET | Freq: Four times a day (QID) | ORAL | Status: DC | PRN
  Administered 2017-10-31 (×2): 100 mg via ORAL
  Filled 2017-10-30 (×2): qty 2

## 2017-10-30 MED ORDER — PHENYLEPHRINE HCL 10 MG/ML IJ SOLN
INTRAMUSCULAR | Status: AC
  Filled 2017-10-30: qty 2

## 2017-10-30 MED ORDER — MIDAZOLAM HCL 5 MG/5ML IJ SOLN
INTRAMUSCULAR | Status: DC | PRN
  Administered 2017-10-30: 2 mg via INTRAVENOUS

## 2017-10-30 MED ORDER — MEPERIDINE HCL 50 MG/ML IJ SOLN: 6.2500 mg | INTRAMUSCULAR | Status: DC | PRN

## 2017-10-30 MED ORDER — CHLORHEXIDINE GLUCONATE 4 % EX LIQD: 60.0000 mL | Freq: Once | CUTANEOUS | Status: DC

## 2017-10-30 MED ORDER — BUPIVACAINE-EPINEPHRINE (PF) 0.25% -1:200000 IJ SOLN
INTRAMUSCULAR | Status: AC
  Filled 2017-10-30: qty 30

## 2017-10-30 MED ORDER — DEXAMETHASONE SODIUM PHOSPHATE 10 MG/ML IJ SOLN
INTRAMUSCULAR | Status: AC
  Filled 2017-10-30: qty 1

## 2017-10-30 MED ORDER — DOCUSATE SODIUM 100 MG PO CAPS
100.0000 mg | ORAL_CAPSULE | Freq: Two times a day (BID) | ORAL | Status: DC
  Administered 2017-10-30 – 2017-10-31 (×2): 100 mg via ORAL
  Filled 2017-10-30 (×2): qty 1

## 2017-10-30 MED ORDER — BUPIVACAINE-EPINEPHRINE (PF) 0.25% -1:200000 IJ SOLN
INTRAMUSCULAR | Status: DC | PRN
  Administered 2017-10-30: 30 mL

## 2017-10-30 MED ORDER — DIPHENHYDRAMINE HCL 12.5 MG/5ML PO ELIX: 12.5000 mg | ORAL_SOLUTION | ORAL | Status: DC | PRN

## 2017-10-30 MED ORDER — ACETAMINOPHEN 10 MG/ML IV SOLN
1000.0000 mg | Freq: Once | INTRAVENOUS | Status: AC
  Administered 2017-10-30: 1000 mg via INTRAVENOUS
  Filled 2017-10-30: qty 100

## 2017-10-30 MED ORDER — SODIUM CHLORIDE 0.9 % IV SOLN
INTRAVENOUS | Status: DC
  Administered 2017-10-30 – 2017-10-31 (×2): via INTRAVENOUS

## 2017-10-30 MED ORDER — FENTANYL CITRATE (PF) 100 MCG/2ML IJ SOLN
INTRAMUSCULAR | Status: DC | PRN
  Administered 2017-10-30: 100 ug via INTRAVENOUS

## 2017-10-30 MED ORDER — ATORVASTATIN CALCIUM 20 MG PO TABS
20.0000 mg | ORAL_TABLET | Freq: Every day | ORAL | Status: DC
  Administered 2017-10-31: 20 mg via ORAL
  Filled 2017-10-30: qty 1

## 2017-10-30 MED ORDER — DEXAMETHASONE SODIUM PHOSPHATE 10 MG/ML IJ SOLN
8.0000 mg | Freq: Once | INTRAMUSCULAR | Status: AC
  Administered 2017-10-30: 10 mg via INTRAVENOUS

## 2017-10-30 MED ORDER — ONDANSETRON HCL 4 MG PO TABS: 4.0000 mg | ORAL_TABLET | Freq: Four times a day (QID) | ORAL | Status: DC | PRN

## 2017-10-30 MED ORDER — LACTATED RINGERS IV SOLN
INTRAVENOUS | Status: DC
  Administered 2017-10-30 (×3): via INTRAVENOUS

## 2017-10-30 MED ORDER — MIDAZOLAM HCL 2 MG/2ML IJ SOLN
INTRAMUSCULAR | Status: AC
  Filled 2017-10-30: qty 2

## 2017-10-30 MED ORDER — STERILE WATER FOR IRRIGATION IR SOLN
Status: DC | PRN
  Administered 2017-10-30: 2000 mL

## 2017-10-30 MED ORDER — 0.9 % SODIUM CHLORIDE (POUR BTL) OPTIME
TOPICAL | Status: DC | PRN
  Administered 2017-10-30: 1000 mL

## 2017-10-30 MED ORDER — ACETAMINOPHEN 500 MG PO TABS
1000.0000 mg | ORAL_TABLET | Freq: Four times a day (QID) | ORAL | Status: AC
  Administered 2017-10-30 – 2017-10-31 (×4): 1000 mg via ORAL
  Filled 2017-10-30 (×4): qty 2

## 2017-10-30 MED ORDER — BUPIVACAINE IN DEXTROSE 0.75-8.25 % IT SOLN
INTRATHECAL | Status: DC | PRN
  Administered 2017-10-30: 1.8 mL via INTRATHECAL

## 2017-10-30 MED ORDER — MENTHOL 3 MG MT LOZG: 1.0000 | LOZENGE | OROMUCOSAL | Status: DC | PRN

## 2017-10-30 MED ORDER — PROPOFOL 500 MG/50ML IV EMUL
INTRAVENOUS | Status: DC | PRN
  Administered 2017-10-30: 100 ug/kg/min via INTRAVENOUS

## 2017-10-30 MED ORDER — PROPOFOL 10 MG/ML IV BOLUS
INTRAVENOUS | Status: AC
  Filled 2017-10-30: qty 20

## 2017-10-30 MED ORDER — BISACODYL 10 MG RE SUPP: 10.0000 mg | Freq: Every day | RECTAL | Status: DC | PRN

## 2017-10-30 MED ORDER — PHENOL 1.4 % MT LIQD: 1.0000 | OROMUCOSAL | Status: DC | PRN

## 2017-10-30 MED ORDER — CEFAZOLIN SODIUM-DEXTROSE 2-4 GM/100ML-% IV SOLN
2.0000 g | Freq: Four times a day (QID) | INTRAVENOUS | Status: AC
  Administered 2017-10-30 – 2017-10-31 (×2): 2 g via INTRAVENOUS
  Filled 2017-10-30 (×2): qty 100

## 2017-10-30 MED ORDER — ACETAMINOPHEN 325 MG PO TABS: 325.0000 mg | ORAL_TABLET | Freq: Four times a day (QID) | ORAL | Status: DC | PRN

## 2017-10-30 MED ORDER — LORATADINE 10 MG PO TABS
10.0000 mg | ORAL_TABLET | Freq: Every day | ORAL | Status: DC
  Administered 2017-10-31: 10 mg via ORAL
  Filled 2017-10-30: qty 1

## 2017-10-30 MED ORDER — TRANEXAMIC ACID 1000 MG/10ML IV SOLN
1000.0000 mg | Freq: Once | INTRAVENOUS | Status: AC
  Administered 2017-10-30: 1000 mg via INTRAVENOUS
  Filled 2017-10-30: qty 10

## 2017-10-30 MED ORDER — METHOCARBAMOL 500 MG PO TABS
500.0000 mg | ORAL_TABLET | Freq: Four times a day (QID) | ORAL | Status: DC | PRN
  Administered 2017-10-30 – 2017-10-31 (×2): 500 mg via ORAL
  Filled 2017-10-30 (×2): qty 1

## 2017-10-30 MED ORDER — ONDANSETRON HCL 4 MG/2ML IJ SOLN: 4.0000 mg | Freq: Four times a day (QID) | INTRAMUSCULAR | Status: DC | PRN

## 2017-10-30 MED ORDER — OXYCODONE HCL 5 MG PO TABS
10.0000 mg | ORAL_TABLET | ORAL | Status: DC | PRN
  Filled 2017-10-30 (×2): qty 2

## 2017-10-30 MED ORDER — PROPOFOL 10 MG/ML IV BOLUS
INTRAVENOUS | Status: AC
  Filled 2017-10-30: qty 60

## 2017-10-30 MED ORDER — LACTATED RINGERS IV SOLN: INTRAVENOUS | Status: DC

## 2017-10-30 MED ORDER — ASPIRIN EC 325 MG PO TBEC
325.0000 mg | DELAYED_RELEASE_TABLET | Freq: Two times a day (BID) | ORAL | Status: DC
  Administered 2017-10-31: 325 mg via ORAL
  Filled 2017-10-30: qty 1

## 2017-10-30 MED ORDER — HYDROMORPHONE HCL 1 MG/ML IJ SOLN: 0.5000 mg | INTRAMUSCULAR | Status: DC | PRN

## 2017-10-30 MED ORDER — ALPRAZOLAM 0.25 MG PO TABS: 0.2500 mg | ORAL_TABLET | Freq: Every evening | ORAL | Status: DC | PRN

## 2017-10-30 MED ORDER — FENTANYL CITRATE (PF) 100 MCG/2ML IJ SOLN
INTRAMUSCULAR | Status: AC
  Filled 2017-10-30: qty 4

## 2017-10-30 MED ORDER — ONDANSETRON HCL 4 MG/2ML IJ SOLN
INTRAMUSCULAR | Status: AC
  Filled 2017-10-30: qty 2

## 2017-10-30 SURGICAL SUPPLY — 37 items
BAG DECANTER FOR FLEXI CONT (MISCELLANEOUS) ×2 IMPLANT
BAG SPEC THK2 15X12 ZIP CLS (MISCELLANEOUS)
BAG ZIPLOCK 12X15 (MISCELLANEOUS) IMPLANT
BLADE EXTENDED COATED 6.5IN (ELECTRODE) ×2 IMPLANT
BLADE SAG 18X100X1.27 (BLADE) ×2 IMPLANT
CAPT HIP TOTAL 2 ×1 IMPLANT
CLOTH BEACON ORANGE TIMEOUT ST (SAFETY) ×2 IMPLANT
COVER PERINEAL POST (MISCELLANEOUS) ×2 IMPLANT
COVER SURGICAL LIGHT HANDLE (MISCELLANEOUS) ×2 IMPLANT
DECANTER SPIKE VIAL GLASS SM (MISCELLANEOUS) ×2 IMPLANT
DRAPE STERI IOBAN 125X83 (DRAPES) ×2 IMPLANT
DRAPE U-SHAPE 47X51 STRL (DRAPES) ×4 IMPLANT
DRSG ADAPTIC 3X8 NADH LF (GAUZE/BANDAGES/DRESSINGS) ×2 IMPLANT
DRSG MEPILEX BORDER 4X4 (GAUZE/BANDAGES/DRESSINGS) ×2 IMPLANT
DRSG MEPILEX BORDER 4X8 (GAUZE/BANDAGES/DRESSINGS) ×2 IMPLANT
DURAPREP 26ML APPLICATOR (WOUND CARE) ×2 IMPLANT
ELECT REM PT RETURN 15FT ADLT (MISCELLANEOUS) ×2 IMPLANT
EVACUATOR 1/8 PVC DRAIN (DRAIN) ×2 IMPLANT
GLOVE BIO SURGEON STRL SZ8 (GLOVE) ×4 IMPLANT
GLOVE BIOGEL PI IND STRL 6.5 (GLOVE) ×1 IMPLANT
GLOVE BIOGEL PI IND STRL 8 (GLOVE) ×2 IMPLANT
GLOVE BIOGEL PI INDICATOR 6.5 (GLOVE) ×6
GLOVE BIOGEL PI INDICATOR 8 (GLOVE) ×2
GLOVE SURG SS PI 6.5 STRL IVOR (GLOVE) ×2 IMPLANT
GOWN STRL REUS W/TWL LRG LVL3 (GOWN DISPOSABLE) ×3 IMPLANT
GOWN STRL REUS W/TWL XL LVL3 (GOWN DISPOSABLE) ×4 IMPLANT
PACK ANTERIOR HIP CUSTOM (KITS) ×2 IMPLANT
STRIP CLOSURE SKIN 1/2X4 (GAUZE/BANDAGES/DRESSINGS) ×2 IMPLANT
SUT ETHIBOND NAB CT1 #1 30IN (SUTURE) ×2 IMPLANT
SUT MNCRL AB 4-0 PS2 18 (SUTURE) ×2 IMPLANT
SUT STRATAFIX 0 PDS 27 VIOLET (SUTURE) ×2
SUT VIC AB 2-0 CT1 27 (SUTURE) ×4
SUT VIC AB 2-0 CT1 TAPERPNT 27 (SUTURE) ×2 IMPLANT
SUTURE STRATFX 0 PDS 27 VIOLET (SUTURE) ×1 IMPLANT
SYR 50ML LL SCALE MARK (SYRINGE) IMPLANT
TRAY FOLEY MTR SLVR 16FR STAT (SET/KITS/TRAYS/PACK) ×2 IMPLANT
YANKAUER SUCT BULB TIP 10FT TU (MISCELLANEOUS) ×2 IMPLANT

## 2017-10-30 NOTE — Anesthesia Procedure Notes (Signed)
Procedure Name: MAC Date/Time: 10/30/2017 11:52 AM Performed by: Lissa Morales, CRNA Pre-anesthesia Checklist: Patient identified, Emergency Drugs available, Suction available, Patient being monitored and Timeout performed Patient Re-evaluated:Patient Re-evaluated prior to induction Oxygen Delivery Method: Simple face mask Placement Confirmation: positive ETCO2

## 2017-10-30 NOTE — Transfer of Care (Signed)
Immediate Anesthesia Transfer of Care Note  Patient: Ian Park  Procedure(s) Performed: RIGHT TOTAL HIP ARTHROPLASTY ANTERIOR APPROACH (Right Hip)  Patient Location: PACU  Anesthesia Type:Spinal  Level of Consciousness: awake, alert , oriented and patient cooperative  Airway & Oxygen Therapy: Patient Spontanous Breathing and Patient connected to face mask oxygen  Post-op Assessment: Report given to RN and Post -op Vital signs reviewed and stable  Post vital signs: stable  Last Vitals:  Vitals Value Taken Time  BP 129/77 10/30/2017  1:45 PM  Temp    Pulse 64 10/30/2017  1:50 PM  Resp 17 10/30/2017  1:50 PM  SpO2 99 % 10/30/2017  1:50 PM  Vitals shown include unvalidated device data.  Last Pain:  Vitals:   10/30/17 1051  TempSrc:   PainSc: 0-No pain         Complications: No apparent anesthesia complications

## 2017-10-30 NOTE — Anesthesia Postprocedure Evaluation (Signed)
Anesthesia Post Note  Patient: Ian Park  Procedure(s) Performed: RIGHT TOTAL HIP ARTHROPLASTY ANTERIOR APPROACH (Right Hip)     Patient location during evaluation: PACU Anesthesia Type: Spinal Level of consciousness: awake and alert Pain management: pain level controlled Vital Signs Assessment: post-procedure vital signs reviewed and stable Respiratory status: spontaneous breathing and respiratory function stable Cardiovascular status: blood pressure returned to baseline and stable Postop Assessment: no headache, no backache, spinal receding and no apparent nausea or vomiting Anesthetic complications: no    Last Vitals:  Vitals:   10/30/17 1616 10/30/17 1701  BP: (!) 154/88 (!) 148/76  Pulse: 65 68  Resp: 16 14  Temp: (!) 36.3 C 36.6 C  SpO2: 98% 97%    Last Pain:  Vitals:   10/30/17 1701  TempSrc: Oral  PainSc:                  Montez Hageman

## 2017-10-30 NOTE — Op Note (Signed)
OPERATIVE REPORT- TOTAL HIP ARTHROPLASTY   PREOPERATIVE DIAGNOSIS: Osteoarthritis of the Right hip.   POSTOPERATIVE DIAGNOSIS: Osteoarthritis of the Right  hip.   PROCEDURE: Right total hip arthroplasty, anterior approach.   SURGEON: Gaynelle Arabian, MD   ASSISTANT: Molli Barrows, PA-C  ANESTHESIA:  Spinal  ESTIMATED BLOOD LOSS:-475 mL    DRAINS: Hemovac x1.   COMPLICATIONS: None   CONDITION: PACU - hemodynamically stable.   BRIEF CLINICAL NOTE: Ian Park is a 67 y.o. male who has rapidly progressive  arthritis of their Right  hip with progressively worsening pain and  dysfunction.The patient has failed nonoperative management and presents for  total hip arthroplasty.   PROCEDURE IN DETAIL: After successful administration of spinal  anesthetic, the traction boots for the [facility] bed were placed on both  feet and the patient was placed onto the [facility] bed, boots placed into the leg  holders. The Right hip was then isolated from the perineum with plastic  drapes and prepped and draped in the usual sterile fashion. ASIS and  greater trochanter were marked and a oblique incision was made, starting  at about 1 cm lateral and 2 cm distal to the ASIS and coursing towards  the anterior cortex of the femur. The skin was cut with a 10 blade  through subcutaneous tissue to the level of the fascia overlying the  tensor fascia lata muscle. The fascia was then incised in line with the  incision at the junction of the anterior third and posterior 2/3rd. The  muscle was teased off the fascia and then the interval between the TFL  and the rectus was developed. The Hohmann retractor was then placed at  the top of the femoral neck over the capsule. The vessels overlying the  capsule were cauterized and the fat on top of the capsule was removed.  A Hohmann retractor was then placed anterior underneath the rectus  femoris to give exposure to the entire anterior capsule. A T-shaped   capsulotomy was performed. The edges were tagged and the femoral head  was identified.       Osteophytes are removed off the superior acetabulum.  The femoral neck was then cut in situ with an oscillating saw. Traction  was then applied to the left lower extremity utilizing the [facility]  traction. The femoral head was then removed. Retractors were placed  around the acetabulum and then circumferential removal of the labrum was  performed. Osteophytes were also removed. Reaming starts at 49 mm to  medialize and  Increased in 2 mm increments to 51 mm. We reamed in  approximately 40 degrees of abduction, 20 degrees anteversion. A 52 mm  pinnacle acetabular shell was then impacted in anatomic position under  fluoroscopic guidance with excellent purchase. We did not need to place  any additional dome screws. A 32 mm neutral + 4 marathon liner was then  placed into the acetabular shell.       The femoral lift was then placed along the lateral aspect of the femur  just distal to the vastus ridge. The leg was  externally rotated and capsule  was stripped off the inferior aspect of the femoral neck down to the  level of the lesser trochanter, this was done with electrocautery. The femur was lifted after this was performed. The  leg was then placed in an extended and adducted position essentially delivering the femur. We also removed the capsule superiorly and the piriformis from the piriformis fossa  to gain excellent exposure of the  proximal femur. Rongeur was used to remove some cancellous bone to get  into the lateral portion of the proximal femur for placement of the  initial starter reamer. The starter broaches was placed  the starter broach  and was shown to go down the center of the canal. Broaching  with the  Corail system was then performed starting at size 8, coursing  Up to size 13. A size 13 had excellent torsional and rotational  and axial stability. The trial standard offset neck was then  placed  with a 32 + 1 trial head. The hip was then reduced. We confirmed that  the stem was in the canal both on AP and lateral x-rays. It also has excellent sizing. The hip was reduced with outstanding stability through full extension and full external rotation.. AP pelvis was taken and the leg lengths were measured and found to be equal. Hip was then dislocated again and the femoral head and neck removed. The  femoral broach was removed. Size 13 Corail stem with a standard offset  neck was then impacted into the femur following native anteversion. Has  excellent purchase in the canal. Excellent torsional and rotational and  axial stability. It is confirmed to be in the canal on AP and lateral  fluoroscopic views. The 32 + 1 ceramic head was placed and the hip  reduced with outstanding stability. Again AP pelvis was taken and it  confirmed that the leg lengths were equal. The wound was then copiously  irrigated with saline solution and the capsule reattached and repaired  with Ethibond suture. 30 ml of .25% Bupivicaine was  injected into the capsule and into the edge of the tensor fascia lata as well as subcutaneous tissue. The fascia overlying the tensor fascia lata was then closed with a running #1 V-Loc. Subcu was closed with interrupted 2-0 Vicryl and subcuticular running 4-0 Monocryl. Incision was cleaned  and dried. Steri-Strips and a bulky sterile dressing applied. Hemovac  drain was hooked to suction and then the patient was awakened and transported to  recovery in stable condition.        Please note that a surgical assistant was a medical necessity for this procedure to perform it in a safe and expeditious manner. Assistant was necessary to provide appropriate retraction of vital neurovascular structures and to prevent femoral fracture and allow for anatomic placement of the prosthesis.  Gaynelle Arabian, M.D.

## 2017-10-30 NOTE — Anesthesia Procedure Notes (Signed)
Spinal  Patient location during procedure: OR End time: 10/30/2017 12:01 PM Staffing Resident/CRNA: Lissa Morales, CRNA Preanesthetic Checklist Completed: patient identified, site marked, surgical consent, pre-op evaluation, timeout performed, IV checked, risks and benefits discussed and monitors and equipment checked Spinal Block Patient position: sitting Prep: Betadine and DuraPrep Patient monitoring: heart rate, continuous pulse ox and blood pressure Approach: midline Injection technique: single-shot Needle Needle type: Pencan  Needle gauge: 24 G Needle length: 9 cm Additional Notes Expiration date of kit checked and confirmed. Patient tolerated procedure well, without complications.

## 2017-10-30 NOTE — Interval H&P Note (Signed)
History and Physical Interval Note:  10/30/2017 10:45 AM  Ian Park  has presented today for surgery, with the diagnosis of Osteoarthritis Right Hip  The various methods of treatment have been discussed with the patient and family. After consideration of risks, benefits and other options for treatment, the patient has consented to  Procedure(s): RIGHT TOTAL HIP ARTHROPLASTY ANTERIOR APPROACH (Right) as a surgical intervention .  The patient's history has been reviewed, patient examined, no change in status, stable for surgery.  I have reviewed the patient's chart and labs.  Questions were answered to the patient's satisfaction.     Pilar Plate Mindy Behnken

## 2017-10-30 NOTE — Anesthesia Preprocedure Evaluation (Signed)
Anesthesia Evaluation  Patient identified by MRN, date of birth, ID band Patient awake    Reviewed: Allergy & Precautions, NPO status , Patient's Chart, lab work & pertinent test results  Airway Mallampati: I  TM Distance: >3 FB Neck ROM: Full    Dental no notable dental hx.    Pulmonary former smoker,    Pulmonary exam normal breath sounds clear to auscultation       Cardiovascular negative cardio ROS Normal cardiovascular exam Rhythm:Regular Rate:Normal     Neuro/Psych  Headaches, negative psych ROS   GI/Hepatic negative GI ROS, Neg liver ROS,   Endo/Other  negative endocrine ROS  Renal/GU negative Renal ROS  negative genitourinary   Musculoskeletal  (+) Arthritis ,   Abdominal   Peds negative pediatric ROS (+)  Hematology negative hematology ROS (+)   Anesthesia Other Findings Hx lymphoma Prostate CA  Reproductive/Obstetrics negative OB ROS                             Anesthesia Physical  Anesthesia Plan  ASA: II  Anesthesia Plan: Spinal   Post-op Pain Management:    Induction:   PONV Risk Score and Plan: 1 and Ondansetron and Treatment may vary due to age or medical condition  Airway Management Planned: Simple Face Mask  Additional Equipment:   Intra-op Plan:   Post-operative Plan:   Informed Consent: I have reviewed the patients History and Physical, chart, labs and discussed the procedure including the risks, benefits and alternatives for the proposed anesthesia with the patient or authorized representative who has indicated his/her understanding and acceptance.     Plan Discussed with: CRNA, Anesthesiologist and Surgeon  Anesthesia Plan Comments:         Anesthesia Quick Evaluation

## 2017-10-31 LAB — BASIC METABOLIC PANEL
ANION GAP: 8 (ref 5–15)
BUN: 15 mg/dL (ref 6–20)
CALCIUM: 8.7 mg/dL — AB (ref 8.9–10.3)
CHLORIDE: 104 mmol/L (ref 101–111)
CO2: 24 mmol/L (ref 22–32)
CREATININE: 0.79 mg/dL (ref 0.61–1.24)
GLUCOSE: 144 mg/dL — AB (ref 65–99)
POTASSIUM: 4.7 mmol/L (ref 3.5–5.1)
Sodium: 136 mmol/L (ref 135–145)

## 2017-10-31 LAB — CBC
HEMATOCRIT: 37.5 % — AB (ref 39.0–52.0)
HEMOGLOBIN: 12.5 g/dL — AB (ref 13.0–17.0)
MCH: 31 pg (ref 26.0–34.0)
MCHC: 33.3 g/dL (ref 30.0–36.0)
MCV: 93.1 fL (ref 78.0–100.0)
Platelets: 258 10*3/uL (ref 150–400)
RBC: 4.03 MIL/uL — AB (ref 4.22–5.81)
RDW: 12.8 % (ref 11.5–15.5)
WBC: 12.8 10*3/uL — AB (ref 4.0–10.5)

## 2017-10-31 MED ORDER — METHOCARBAMOL 500 MG PO TABS: 500.0000 mg | ORAL_TABLET | Freq: Four times a day (QID) | ORAL | 0 refills | Status: DC | PRN

## 2017-10-31 MED ORDER — ASPIRIN 325 MG PO TBEC: 325.0000 mg | DELAYED_RELEASE_TABLET | Freq: Two times a day (BID) | ORAL | 0 refills | Status: DC

## 2017-10-31 MED ORDER — OXYCODONE HCL 5 MG PO TABS: 5.0000 mg | ORAL_TABLET | Freq: Four times a day (QID) | ORAL | 0 refills | Status: DC | PRN

## 2017-10-31 MED ORDER — TRAMADOL HCL 50 MG PO TABS: 50.0000 mg | ORAL_TABLET | Freq: Four times a day (QID) | ORAL | 0 refills | Status: DC | PRN

## 2017-10-31 NOTE — Evaluation (Signed)
Physical Therapy Evaluation Patient Details Name: Ian Park MRN: [id number] DOB: 11/13/1950 Today's Date: 10/31/2017   History of Present Illness  Pt s/p R THR and with hx of L THR  Clinical Impression  Pt s/p R THR and presents with decreased R LE strength/ROM and post op pain limiting functional mobility.  Pt should progress to dc home with family assist.    Follow Up Recommendations Follow surgeon's recommendation for DC plan and follow-up therapies    Equipment Recommendations  None recommended by PT    Recommendations for Other Services       Precautions / Restrictions Precautions Precautions: Fall Restrictions Weight Bearing Restrictions: No Other Position/Activity Restrictions: WBAT      Mobility  Bed Mobility Overal bed mobility: Needs Assistance Bed Mobility: Supine to Sit     Supine to sit: Min guard     General bed mobility comments: Increased time with cues for sequence and use of L LE to self assist  Transfers Overall transfer level: Needs assistance Equipment used: Rolling walker (2 wheeled) Transfers: Sit to/from Stand Sit to Stand: Min assist;Min guard         General transfer comment: cues for LE management and use of UEs to self assist  Ambulation/Gait Ambulation/Gait assistance: Min assist;Min guard Ambulation Distance (Feet): 200 Feet Assistive device: Rolling walker (2 wheeled) Gait Pattern/deviations: Step-to pattern;Step-through pattern;Decreased step length - right;Decreased step length - left;Shuffle;Narrow base of support Gait velocity: decr   General Gait Details: cues for posture, position from RW, initial sequence and to increase BOS  Stairs            Wheelchair Mobility    Modified Rankin (Stroke Patients Only)       Balance                                             Pertinent Vitals/Pain Pain Assessment: 0-10 Pain Score: 3  Pain Location: R hip Pain Descriptors / Indicators:  Aching;Sore Pain Intervention(s): Limited activity within patient's tolerance;Monitored during session;Premedicated before session;Ice applied    Home Living Family/patient expects to be discharged to:: Private residence Living Arrangements: Alone Available Help at Discharge: Family Type of Home: House Home Access: Stairs to enter Entrance Stairs-Rails: Psychiatric nurse of Steps: 8 Home Layout: Two level Home Equipment: Environmental consultant - 2 wheels;Cane - single point;Bedside commode      Prior Function Level of Independence: Independent               Hand Dominance        Extremity/Trunk Assessment   Upper Extremity Assessment Upper Extremity Assessment: Overall WFL for tasks assessed    Lower Extremity Assessment Lower Extremity Assessment: RLE deficits/detail RLE Deficits / Details: Strength at hip 2?5 with AAROM at hip to 90 flex and 20 abd    Cervical / Trunk Assessment Cervical / Trunk Assessment: Normal  Communication   Communication: No difficulties  Cognition Arousal/Alertness: Awake/alert Behavior During Therapy: WFL for tasks assessed/performed Overall Cognitive Status: Within Functional Limits for tasks assessed                                        General Comments      Exercises Total Joint Exercises Ankle Circles/Pumps: AROM;Both;15 reps;Supine Quad Sets: AROM;Both;10 reps;Supine Heel  Slides: AAROM;Right;20 reps;Supine Hip ABduction/ADduction: AAROM;Right;15 reps;Supine   Assessment/Plan    PT Assessment Patient needs continued PT services  PT Problem List Decreased strength;Decreased range of motion;Decreased activity tolerance;Decreased mobility;Decreased knowledge of use of DME;Pain       PT Treatment Interventions DME instruction;Gait training;Stair training;Functional mobility training;Therapeutic activities;Therapeutic exercise;Patient/family education    PT Goals (Current goals can be found in the Care  Plan section)  Acute Rehab PT Goals Patient Stated Goal: Regain IND PT Goal Formulation: With patient Time For Goal Achievement: 11/07/17 Potential to Achieve Goals: Good    Frequency 7X/week   Barriers to discharge        Co-evaluation               AM-PAC PT "6 Clicks" Daily Activity  Outcome Measure Difficulty turning over in bed (including adjusting bedclothes, sheets and blankets)?: A Lot Difficulty moving from lying on back to sitting on the side of the bed? : A Lot Difficulty sitting down on and standing up from a chair with arms (e.g., wheelchair, bedside commode, etc,.)?: A Lot Help needed moving to and from a bed to chair (including a wheelchair)?: A Little Help needed walking in hospital room?: A Little Help needed climbing 3-5 steps with a railing? : A Little 6 Click Score: 15    End of Session Equipment Utilized During Treatment: Gait belt Activity Tolerance: Patient tolerated treatment well Patient left: in chair;with call bell/phone within reach Nurse Communication: Mobility status PT Visit Diagnosis: Difficulty in walking, not elsewhere classified (R26.2)    Time: 3300-7622 PT Time Calculation (min) (ACUTE ONLY): 38 min   Charges:     PT Treatments $Gait Training: 8-22 mins $Therapeutic Exercise: 8-22 mins   PT G Codes:        Pg [phone]   Jousha Schwandt 10/31/2017, 10:11 AM

## 2017-10-31 NOTE — Progress Notes (Signed)
Physical Therapy Treatment Patient Details Name: Ian Park MRN: [id number] DOB: 10/11/1950 Today's Date: 10/31/2017    History of Present Illness Pt s/p R THR and with hx of L THR    PT Comments    Pt progressing well with mobility and eager for return home.  Reviewed car transfers, stairs and home therex program with written instruction and progression.     Follow Up Recommendations  Follow surgeon's recommendation for DC plan and follow-up therapies     Equipment Recommendations  None recommended by PT    Recommendations for Other Services       Precautions / Restrictions Precautions Precautions: Fall Restrictions Weight Bearing Restrictions: No Other Position/Activity Restrictions: WBAT    Mobility  Bed Mobility Overal bed mobility: Needs Assistance Bed Mobility: Supine to Sit     Supine to sit: Min guard     General bed mobility comments: NT - pt up in chair and requests back to same  Transfers Overall transfer level: Needs assistance Equipment used: Rolling walker (2 wheeled) Transfers: Sit to/from Stand Sit to Stand: Supervision         General transfer comment: cues for LE management and use of UEs to self assist  Ambulation/Gait Ambulation/Gait assistance: Min guard;Supervision Ambulation Distance (Feet): 150 Feet Assistive device: Rolling walker (2 wheeled) Gait Pattern/deviations: Step-to pattern;Step-through pattern;Decreased step length - right;Decreased step length - left;Shuffle;Narrow base of support Gait velocity: decr   General Gait Details: cues for posture, position from RW, initial sequence and to increase BOS   Stairs Stairs: Yes Stairs assistance: Min guard Stair Management: One rail Right;Step to pattern;Forwards;With cane Number of Stairs: 5 General stair comments: cues for sequence and cane/foot placement   Wheelchair Mobility    Modified Rankin (Stroke Patients Only)       Balance Overall balance assessment: No  apparent balance deficits (not formally assessed)                                          Cognition Arousal/Alertness: Awake/alert Behavior During Therapy: WFL for tasks assessed/performed Overall Cognitive Status: Within Functional Limits for tasks assessed                                        Exercises Total Joint Exercises Ankle Circles/Pumps: AROM;Both;15 reps;Supine Quad Sets: AROM;Both;10 reps;Supine Heel Slides: AAROM;Right;20 reps;Supine Hip ABduction/ADduction: AAROM;Right;15 reps;Supine Long Arc Quad: AROM;Right;10 reps;Supine    General Comments        Pertinent Vitals/Pain Pain Assessment: 0-10 Pain Score: 3  Pain Location: R hip Pain Descriptors / Indicators: Aching;Sore Pain Intervention(s): Limited activity within patient's tolerance;Monitored during session;Ice applied;Premedicated before session    Home Living Family/patient expects to be discharged to:: Private residence Living Arrangements: Alone Available Help at Discharge: Family Type of Home: House Home Access: Stairs to enter Entrance Stairs-Rails: [place]: Two level Home Equipment: Environmental consultant - 2 wheels;Cane - single point;Bedside commode      Prior Function Level of Independence: Independent          PT Goals (current goals can now be found in the care plan section) Acute Rehab PT Goals Patient Stated Goal: Regain IND PT Goal Formulation: With patient Time For Goal Achievement: 11/07/17 Potential to Achieve Goals: Good Progress towards PT goals: Progressing toward goals  Frequency    7X/week      PT Plan Current plan remains appropriate    Co-evaluation              AM-PAC PT "6 Clicks" Daily Activity  Outcome Measure  Difficulty turning over in bed (including adjusting bedclothes, sheets and blankets)?: A Lot Difficulty moving from lying on back to sitting on the side of the bed? : A Lot Difficulty sitting down on and  standing up from a chair with arms (e.g., wheelchair, bedside commode, etc,.)?: A Lot Help needed moving to and from a bed to chair (including a wheelchair)?: A Little Help needed walking in hospital room?: A Little Help needed climbing 3-5 steps with a railing? : A Little 6 Click Score: 15    End of Session Equipment Utilized During Treatment: Gait belt Activity Tolerance: Patient tolerated treatment well Patient left: in chair;with call bell/phone within reach Nurse Communication: Mobility status PT Visit Diagnosis: Difficulty in walking, not elsewhere classified (R26.2)     Time: 2023-3435 PT Time Calculation (min) (ACUTE ONLY): 43 min  Charges:  $Gait Training: 8-22 mins $Therapeutic Exercise: 8-22 mins $Therapeutic Activity: 8-22 mins                    G Codes:       Pg [phone]    Marjon Doxtater 10/31/2017, 12:35 PM

## 2017-10-31 NOTE — Progress Notes (Signed)
Care Plan Notes 08/02/2017 to 10/31/2017       Care Plan by Leonides Grills A at 10/29/2017 11:56 AM    Date of Service   Author Author Type Status Note Type File Time  10/29/2017  Ian Park  Signed Care Plan 10/29/2017             Ortho Bundle L THA scheduled 10-30-17 DCP:  Home with son.  2 story home with 6 ste.   DME:  Rx for RW and 3-in-1 given prior to surgery PT:  HEP

## 2017-10-31 NOTE — Discharge Instructions (Signed)
Dr. Gaynelle Arabian Total Joint Specialist [facility] [address], [place], [place] [address] [phone]  ANTERIOR APPROACH TOTAL HIP REPLACEMENT POSTOPERATIVE DIRECTIONS   Hip Rehabilitation, Guidelines Following Surgery  The results of a hip operation are greatly improved after range of motion and muscle strengthening exercises. Follow all safety measures which are given to protect your hip. If any of these exercises cause increased pain or swelling in your joint, decrease the amount until you are comfortable again. Then slowly increase the exercises. Call your caregiver if you have problems or questions.   BLOOD CLOT PREVENTION:   HOME CARE INSTRUCTIONS:  Remove items at home which could result in a fall. This includes throw rugs or furniture in walking pathways.   ICE to the affected hip every three hours for 30 minutes at a time and then as needed for pain and swelling.  Continue to use ice on the hip for pain and swelling from surgery. You may notice swelling that will progress down to the foot and ankle.  This is normal after surgery.  Elevate the leg when you are not up walking on it.    Continue to use the breathing machine which will help keep your temperature down.  It is common for your temperature to cycle up and down following surgery, especially at night when you are not up moving around and exerting yourself.  The breathing machine keeps your lungs expanded and your temperature down.   DIET You may resume your previous home diet once your are discharged from the hospital.  DRESSING / WOUND CARE / SHOWERING You may shower 3 days after surgery, but keep the wounds dry during showering.  You may use an occlusive plastic wrap (Press'n Seal for example), NO SOAKING/SUBMERGING IN THE BATHTUB.  If the bandage gets wet, change with a clean dry gauze.  If the incision gets wet, pat the wound dry with a clean towel. You may start showering once you are discharged  home but do not submerge the incision under water. Just pat the incision dry and apply a dry gauze dressing on daily. Change the surgical dressing daily and reapply a dry dressing each time.  ACTIVITY Walk with your walker as instructed. Use walker as long as suggested by your caregivers. Avoid periods of inactivity such as sitting longer than an hour when not asleep. This helps prevent blood clots.  You may resume a sexual relationship in one month or when given the OK by your doctor.  You may return to work once you are cleared by your doctor.  Do not drive a car for 6 weeks or until released by you surgeon.  Do not drive while taking narcotics.  WEIGHT BEARING Weight bearing as tolerated with assist device (walker, cane, etc) as directed, use it as long as suggested by your surgeon or therapist, typically at least 4-6 weeks.  POSTOPERATIVE CONSTIPATION PROTOCOL Constipation - defined medically as fewer than three stools per week and severe constipation as less than one stool per week.  One of the most common issues patients have following surgery is constipation.  Even if you have a regular bowel pattern at home, your normal regimen is likely to be disrupted due to multiple reasons following surgery.  Combination of anesthesia, postoperative narcotics, change in appetite and fluid intake all can affect your bowels.  In order to avoid complications following surgery, here are some recommendations in order to help you during your recovery period.  Colace (docusate) -  Pick up an over-the-counter form of Colace or another stool softener and take twice a day as long as you are requiring postoperative pain medications.  Take with a full glass of water daily.  If you experience loose stools or diarrhea, hold the colace until you stool forms back up.  If your symptoms do not get better within 1 week or if they get worse, check with your doctor.  Dulcolax (bisacodyl) - Pick up over-the-counter and  take as directed by the product packaging as needed to assist with the movement of your bowels.  Take with a full glass of water.  Use this product as needed if not relieved by Colace only.   MiraLax (polyethylene glycol) - Pick up over-the-counter to have on hand.  MiraLax is a solution that will increase the amount of water in your bowels to assist with bowel movements.  Take as directed and can mix with a glass of water, juice, soda, coffee, or tea.  Take if you go more than two days without a movement. Do not use MiraLax more than once per day. Call your doctor if you are still constipated or irregular after using this medication for 7 days in a row.  If you continue to have problems with postoperative constipation, please contact the office for further assistance and recommendations.  If you experience "the worst abdominal pain ever" or develop nausea or vomiting, please contact the office immediatly for further recommendations for treatment.  ITCHING  If you experience itching with your medications, try taking only a single pain pill, or even half a pain pill at a time.  You can also use Benadryl over the counter for itching or also to help with sleep.   TED HOSE STOCKINGS Wear the elastic stockings on both legs for three weeks following surgery during the day but you may remove then at night for sleeping.  MEDICATIONS See your medication summary on the After Visit Summary that the nursing staff will review with you prior to discharge.  You may have some home medications which will be placed on hold until you complete the course of blood thinner medication.  It is important for you to complete the blood thinner medication as prescribed by your surgeon.  Continue your approved medications as instructed at time of discharge.  PRECAUTIONS If you experience chest pain or shortness of breath - call 911 immediately for transfer to the hospital emergency department.  If you develop a fever greater  that 101 F, purulent drainage from wound, increased redness or drainage from wound, foul odor from the wound/dressing, or calf pain - CONTACT YOUR SURGEON.                                                   FOLLOW-UP APPOINTMENTS Make sure you keep all of your appointments after your operation with your surgeon and caregivers. You should call the office at the above phone number and make an appointment for approximately two weeks after the date of your surgery or on the date instructed by your surgeon outlined in the "After Visit Summary".  RANGE OF MOTION AND STRENGTHENING EXERCISES  These exercises are designed to help you keep full movement of your hip joint. Follow your caregiver's or physical therapist's instructions. Perform all exercises about fifteen times, three times per day or as directed. Exercise both  hips, even if you have had only one joint replacement. These exercises can be done on a training (exercise) mat, on the floor, on a table or on a bed. Use whatever works the best and is most comfortable for you. Use music or television while you are exercising so that the exercises are a pleasant break in your day. This will make your life better with the exercises acting as a break in routine you can look forward to.  Lying on your back, slowly slide your foot toward your buttocks, raising your knee up off the floor. Then slowly slide your foot back down until your leg is straight again.  Lying on your back spread your legs as far apart as you can without causing discomfort.  Lying on your side, raise your upper leg and foot straight up from the floor as far as is comfortable. Slowly lower the leg and repeat.  Lying on your back, tighten up the muscle in the front of your thigh (quadriceps muscles). You can do this by keeping your leg straight and trying to raise your heel off the floor. This helps strengthen the largest muscle supporting your knee.  Lying on your back, tighten up the muscles of  your buttocks both with the legs straight and with the knee bent at a comfortable angle while keeping your heel on the floor.   IF YOU ARE TRANSFERRED TO A SKILLED REHAB FACILITY If the patient is transferred to a skilled rehab facility following release from the hospital, a list of the current medications will be sent to the facility for the patient to continue.  When discharged from the skilled rehab facility, please have the facility set up the patient's [place] prior to being released. Also, the skilled facility will be responsible for providing the patient with their medications at time of release from the facility to include their pain medication, the muscle relaxants, and their blood thinner medication. If the patient is still at the rehab facility at time of the two week follow up appointment, the skilled rehab facility will also need to assist the patient in arranging follow up appointment in our office and any transportation needs.  MAKE SURE YOU:  Understand these instructions.  Get help right away if you are not doing well or get worse.    Pick up stool softner and laxative for home use following surgery while on pain medications. Do not submerge incision under water. Please use good hand washing techniques while changing dressing each day. May shower starting three days after surgery. Please use a clean towel to pat the incision dry following showers. Continue to use ice for pain and swelling after surgery. Do not use any lotions or creams on the incision until instructed by your surgeon.

## 2017-10-31 NOTE — Progress Notes (Signed)
   Subjective: 1 Day Post-Op Procedure(s) (LRB): RIGHT TOTAL HIP ARTHROPLASTY ANTERIOR APPROACH (Right) Patient reports pain as mild.   Patient seen in rounds with Dr. Wynelle Link. Patient is well, and has had no acute complaints or problems. No SOB or chest pain. No issues overnight. reports that he got minimal sleep last night. Foley catheter just removed.  We will start therapy today.  Plan is to go Home after hospital stay.  Objective: Vital signs in last 24 hours: Temp:  [97.4 F (36.3 C)-98.6 F (37 C)] 98.2 F (36.8 C) (05/23 0443) Pulse Rate:  [56-88] 75 (05/23 0443) Resp:  [10-19] 15 (05/23 0443) BP: (121-159)/(75-90) 130/79 (05/23 0443) SpO2:  [94 %-99 %] 97 % (05/23 0443) Weight:  [100.2 kg (221 lb)] 100.2 kg (221 lb) (05/22 1051)  Intake/Output from previous day:  Intake/Output Summary (Last 24 hours) at 10/31/2017 0742 Last data filed at 10/31/2017 7408 Gross per 24 hour  Intake 5173.33 ml  Output 3345 ml  Net 1828.33 ml     Labs: Recent Labs    10/31/17 0552  HGB 12.5*   Recent Labs    10/31/17 0552  WBC 12.8*  RBC 4.03*  HCT 37.5*  PLT 258   Recent Labs    10/31/17 0552  NA 136  K 4.7  CL 104  CO2 24  BUN 15  CREATININE 0.79  GLUCOSE 144*  CALCIUM 8.7*   EXAM General - Patient is Alert and Oriented Extremity - Neurologically intact Intact pulses distally Dorsiflexion/Plantar flexion intact No cellulitis present Compartment soft Dressing - dressing C/D/I Motor Function - intact, moving foot and toes well on exam.  Hemovac pulled without difficulty.  Past Medical History:  Diagnosis Date  . Arthritis    oa, hips and right shoulder  . Birthmark    right leg portwine stain birthmark  . Cancer ([place])    lymphoma- tx. last with chemo 2011-currently remission-Dr. Keene Breath  . Complication of anesthesia    "slow to wake up and PEE"  . Headache    occ. stress related  . History of chemotherapy 2011   none currently  . History of kidney  stones    x1 episode. Bladder stone at present  . Hx of seasonal allergies   . Hypercholesterolemia   . 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 Prostate cancer ([place])   . Shin injury    red area no drainage pt says present for last 30 years    Assessment/Plan: 1 Day Post-Op Procedure(s) (LRB): RIGHT TOTAL HIP ARTHROPLASTY ANTERIOR APPROACH (Right) Active Problems:   OA (osteoarthritis) of hip  Estimated body mass index is 34.61 kg/m as calculated from the following:   Height as of this encounter: 5\' 7"  (1.702 m).   Weight as of this encounter: 100.2 kg (221 lb). Advance diet Up with therapy D/C IV fluids when tolerating POs well  DVT Prophylaxis - Aspirin Weight Bearing As Tolerated  Hemovac Pulled Begin Therapy  Will have him get up and work with therapy today. Plan for two sessions and DC home this afternoon/evening if meets his goals. Follow up in office in 2 weeks.   Ardeen Jourdain, PA-C Orthopaedic Surgery 10/31/2017, 7:42 AM

## 2017-11-05 NOTE — Discharge Summary (Signed)
Physician Discharge Summary   Patient ID: Ian Park MRN: [id number] DOB/AGE: 1951/02/19 67 y.o.  Admit date: 10/30/2017 Discharge date: 10/31/2017  Primary Diagnosis: Primary osteoarthritis right hip   Admission Diagnoses:  Past Medical History:  Diagnosis Date  . Arthritis    oa, hips and right shoulder  . Birthmark    right leg portwine stain birthmark  . Cancer ([place])    lymphoma- tx. last with chemo 2011-currently remission-Dr. Keene Breath  . Complication of anesthesia    "slow to wake up and PEE"  . Headache    occ. stress related  . History of chemotherapy 2011   none currently  . History of kidney stones    x1 episode. Bladder stone at present  . Hx of seasonal allergies   . Hypercholesterolemia   . 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 Prostate cancer ([place])   . Shin injury    red area no drainage pt says present for last 30 years   Discharge Diagnoses:   Active Problems:   OA (osteoarthritis) of hip  Estimated body mass index is 34.61 kg/m as calculated from the following:   Height as of this encounter: _0  (1.702 m).   Weight as of this encounter: 100.2 kg (221 lb).  Procedure(s) (LRB): RIGHT TOTAL HIP ARTHROPLASTY ANTERIOR APPROACH (Right)   Consults: None  HPI: Ian Park, 67 y.o. male, has a history of pain and functional disability in the right hip(s) due to arthritis and patient has failed non-surgical conservative treatments for greater than 12 weeks to include NSAID's and/or analgesics, corticosteriod injections, flexibility and strengthening excercises and activity modification.  Onset of symptoms was gradual starting 3 years ago with gradually worsening course since that time.The patient noted no past surgery on the right hip(s).  Patient currently rates pain in the right hip at 7 out of 10 with activity. Patient has night pain, worsening of pain with activity and weight bearing, pain that interfers with activities of daily living  and pain with passive range of motion. Patient has evidence of periarticular osteophytes and joint space narrowing by imaging studies. This condition presents safety issues increasing the risk of falls. There is no current active infection.    Laboratory Data: Admission on 10/30/2017, Discharged on 10/31/2017  Component Date Value Ref Range Status  . WBC 10/31/2017 12.8* 4.0 - 10.5 K/uL Final  . RBC 10/31/2017 4.03* 4.22 - 5.81 MIL/uL Final  . Hemoglobin 10/31/2017 12.5* 13.0 - 17.0 g/dL Final  . HCT 10/31/2017 37.5* 39.0 - 52.0 % Final  . MCV 10/31/2017 93.1  78.0 - 100.0 fL Final  . MCH 10/31/2017 31.0  26.0 - 34.0 pg Final  . MCHC 10/31/2017 33.3  30.0 - 36.0 g/dL Final  . RDW 10/31/2017 12.8  11.5 - 15.5 % Final  . Platelets 10/31/2017 258  150 - 400 K/uL Final   Performed at [facility], [place] [address], [place], [place] [address]  . Sodium 10/31/2017 136  135 - 145 mmol/L Final  . Potassium 10/31/2017 4.7  3.5 - 5.1 mmol/L Final  . Chloride 10/31/2017 104  101 - 111 mmol/L Final  . CO2 10/31/2017 24  22 - 32 mmol/L Final  . Glucose, Bld 10/31/2017 144* 65 - 99 mg/dL Final  . BUN 10/31/2017 15  6 - 20 mg/dL Final  . Creatinine, Ser 10/31/2017 0.79  0.61 - 1.24 mg/dL Final  . Calcium 10/31/2017 8.7* 8.9 - 10.3 mg/dL Final  .  GFR calc non Af Amer 10/31/2017 >60  >60 mL/min Final  . GFR calc Af Amer 10/31/2017 >60  >60 mL/min Final   Comment: (NOTE) The eGFR has been calculated using the CKD EPI equation. This calculation has not been validated in all clinical situations. eGFR's persistently <60 mL/min signify possible Chronic Kidney Disease.   Georgiann Hahn gap 10/31/2017 8  5 - 15 Final   Performed at [facility], [place] [address], [place], [place] [address]  Hospital Outpatient Visit on 10/23/2017  Component Date Value Ref Range Status  . aPTT 10/23/2017 30  24 - 36 seconds Final   Performed at [facility], [place] [address] [place], [place] [address]  . WBC 10/23/2017 5.3  4.0 - 10.5 K/uL Final  . RBC 10/23/2017 4.43  4.22 - 5.81 MIL/uL Final  . Hemoglobin 10/23/2017 14.1  13.0 - 17.0 g/dL Final  . HCT 10/23/2017 41.8  39.0 - 52.0 % Final  . MCV 10/23/2017 94.4  78.0 - 100.0 fL Final  . MCH 10/23/2017 31.8  26.0 - 34.0 pg Final  . MCHC 10/23/2017 33.7  30.0 - 36.0 g/dL Final  . RDW 10/23/2017 12.9  11.5 - 15.5 % Final  . Platelets 10/23/2017 307  150 - 400 K/uL Final   Performed at [facility], [place] [address] [place], [place] [address]  . Sodium 10/23/2017 140  135 - 145 mmol/L Final  . Potassium 10/23/2017 4.5  3.5 - 5.1 mmol/L Final  . Chloride 10/23/2017 107  101 - 111 mmol/L Final  . CO2 10/23/2017 24  22 - 32 mmol/L Final  . Glucose, Bld 10/23/2017 111* 65 - 99 mg/dL Final  . BUN 10/23/2017 18  6 - 20 mg/dL Final  . Creatinine, Ser 10/23/2017 0.84  0.61 - 1.24 mg/dL Final  . Calcium 10/23/2017 9.3  8.9 - 10.3 mg/dL Final  . Total Protein 10/23/2017 7.2  6.5 - 8.1 g/dL Final  . Albumin 10/23/2017 4.1  3.5 - 5.0 g/dL Final  . AST 10/23/2017 23  15 - 41 U/L Final  . ALT 10/23/2017 23  17 - 63 U/L Final  . Alkaline Phosphatase 10/23/2017 55  38 - 126 U/L Final  . Total Bilirubin 10/23/2017 0.6  0.3 - 1.2 mg/dL Final  . GFR calc non Af Amer 10/23/2017 >60  >60 mL/min Final  . GFR calc Af Amer 10/23/2017 >60  >60 mL/min Final   Comment: (NOTE) The eGFR has been calculated using the CKD EPI equation. This calculation has not been validated in all clinical situations. eGFR's persistently <60 mL/min signify possible Chronic Kidney Disease.   Georgiann Hahn gap 10/23/2017 9  5 - 15 Final   Performed at [facility], [place] [address] [place], [place] [address]  . Prothrombin Time 10/23/2017 12.9  11.4 - 15.2 seconds Final  . INR 10/23/2017 0.98   Final   Performed at [place] [address], [place], [place] [address]  . ABO/RH(D) 10/23/2017 A POS    Final  . Antibody Screen 10/23/2017 NEG   Final  . Sample Expiration 10/23/2017 11/02/2017   Final  . Extend sample reason 10/23/2017    Final                   Value:NO TRANSFUSIONS OR PREGNANCY IN THE PAST 3 MONTHS Performed at [facility], [place] [address] [place], [place] [address]   . Color, Urine 10/23/2017 YELLOW  YELLOW Final  . APPearance 10/23/2017 CLEAR  CLEAR Final  . Specific Gravity, Urine 10/23/2017 1.021  1.005 - 1.030 Final  . pH 10/23/2017 6.0  5.0 - 8.0 Final  . Glucose, UA 10/23/2017 NEGATIVE  NEGATIVE mg/dL Final  . Hgb urine dipstick 10/23/2017 NEGATIVE  NEGATIVE Final  . Bilirubin Urine 10/23/2017 NEGATIVE  NEGATIVE Final  . Ketones, ur 10/23/2017 NEGATIVE  NEGATIVE mg/dL Final  . Protein, ur 10/23/2017 NEGATIVE  NEGATIVE mg/dL Final  . Nitrite 10/23/2017 NEGATIVE  NEGATIVE Final  . Leukocytes, UA 10/23/2017 NEGATIVE  NEGATIVE Final   Performed at [place] [address], [place], [place] [address]  . MRSA, PCR 10/23/2017 NEGATIVE  NEGATIVE Final  . Staphylococcus aureus 10/23/2017 NEGATIVE  NEGATIVE Final   Comment: (NOTE) The Xpert SA Assay (FDA approved for NASAL specimens in patients 66 years of age and older), is one component of a comprehensive surveillance program. It is not intended to diagnose infection nor to guide or monitor treatment. Performed at [facility], [place] [address] [place], [place] [address]      X-Rays:Dg Pelvis Portable  Result Date: 10/30/2017 CLINICAL DATA:  Postop right total hip replacement EXAM: PORTABLE PELVIS 1-2 VIEWS COMPARISON:  05/30/2015 FINDINGS: Remote changes of left hip replacement. New changes of right hip replacement. Soft tissue drain on the right. Normal AP alignment. No hardware or bony complicating feature. IMPRESSION: New right hip replacement changes. No hardware complicating feature. Electronically Signed   By: Rolm Baptise M.D.   On: 10/30/2017  14:21   Dg C-arm 1-60 Min-no Report  Result Date: 10/30/2017 Fluoroscopy was utilized by the requesting physician.  No radiographic interpretation.    Hospital Course: Patient was admitted to [facility] and taken to the OR and underwent the above state procedure without complications.  Patient tolerated the procedure well and was later transferred to the recovery room and then to the orthopaedic floor for postoperative care.  They were given PO and IV analgesics for pain control following their surgery.  They were given 24 hours of postoperative antibiotics of  Anti-infectives (From admission, onward)   Start     Dose/Rate Route Frequency Ordered Stop   10/30/17 1800  ceFAZolin (ANCEF) IVPB 2g/100 mL premix     2 g 200 mL/hr over 30 Minutes Intravenous Every 6 hours 10/30/17 1457 10/31/17 0033   10/30/17 1030  ceFAZolin (ANCEF) IVPB 2g/100 mL premix     2 g 200 mL/hr over 30 Minutes Intravenous On call to O.R. 10/30/17 1029 10/30/17 1209     and started on DVT prophylaxis in the form of Aspirin.   PT and OT were ordered for total hip protocol.  The patient was allowed to be WBAT with therapy. Discharge planning was consulted to help with postop disposition and equipment needs.  Patient had a good night on the evening of surgery.  They started to get up OOB with therapy on day one.  Patient was seen in rounds and was ready to go home.  Diet: Cardiac diet Activity:WBAT Follow-up:in 2 weeks Disposition - Home Discharged Condition: stable   Discharge Instructions    Call MD / Call 911   Complete by:  As directed    If you experience chest pain or shortness of breath, CALL 911 and be transported to the hospital emergency room.  If you develope a fever above 101 F, pus (white drainage) or increased drainage or redness at the wound, or calf pain, call  your surgeon's office.   Constipation Prevention   Complete by:  As directed    Drink plenty of fluids.  Prune juice may be helpful.   You may use a stool softener, such as Colace (over the counter) 100 mg twice a day.  Use MiraLax (over the counter) for constipation as needed.   Diet - low sodium heart healthy   Complete by:  As directed    Discharge instructions   Complete by:  As directed    Dr. Gaynelle Arabian Total Joint Specialist [facility] [address] [place], [place] [address] [phone]  ANTERIOR APPROACH TOTAL HIP REPLACEMENT POSTOPERATIVE DIRECTIONS   Hip Rehabilitation, Guidelines Following Surgery  The results of a hip operation are greatly improved after range of motion and muscle strengthening exercises. Follow all safety measures which are given to protect your hip. If any of these exercises cause increased pain or swelling in your joint, decrease the amount until you are comfortable again. Then slowly increase the exercises. Call your caregiver if you have problems or questions.   BLOOD CLOT PREVENTION:   HOME CARE INSTRUCTIONS:  Remove items at home which could result in a fall. This includes throw rugs or furniture in walking pathways.  ICE to the affected hip every three hours for 30 minutes at a time and then as needed for pain and swelling.  Continue to use ice on the hip for pain and swelling from surgery. You may notice swelling that will progress down to the foot and ankle.  This is normal after surgery.  Elevate the leg when you are not up walking on it.   Continue to use the breathing machine which will help keep your temperature down.  It is common for your temperature to cycle up and down following surgery, especially at night when you are not up moving around and exerting yourself.  The breathing machine keeps your lungs expanded and your temperature down.   DIET You may resume your previous home diet once your are discharged from the hospital.  DRESSING / WOUND CARE / SHOWERING You may shower 3 days after surgery, but keep the wounds dry during showering.  You may use  an occlusive plastic wrap (Press'n Seal for example), NO SOAKING/SUBMERGING IN THE BATHTUB.  If the bandage gets wet, change with a clean dry gauze.  If the incision gets wet, pat the wound dry with a clean towel. You may start showering once you are discharged home but do not submerge the incision under water. Just pat the incision dry and apply a dry gauze dressing on daily. Change the surgical dressing daily and reapply a dry dressing each time.  ACTIVITY Walk with your walker as instructed. Use walker as long as suggested by your caregivers. Avoid periods of inactivity such as sitting longer than an hour when not asleep. This helps prevent blood clots.  You may resume a sexual relationship in one month or when given the OK by your doctor.  You may return to work once you are cleared by your doctor.  Do not drive a car for 6 weeks or until released by you surgeon.  Do not drive while taking narcotics.  WEIGHT BEARING Weight bearing as tolerated with assist device (walker, cane, etc) as directed, use it as long as suggested by your surgeon or therapist, typically at least 4-6 weeks.  POSTOPERATIVE CONSTIPATION PROTOCOL Constipation - defined medically as fewer than three stools per week and severe constipation as less than  one stool per week.  One of the most common issues patients have following surgery is constipation.  Even if you have a regular bowel pattern at home, your normal regimen is likely to be disrupted due to multiple reasons following surgery.  Combination of anesthesia, postoperative narcotics, change in appetite and fluid intake all can affect your bowels.  In order to avoid complications following surgery, here are some recommendations in order to help you during your recovery period.  Colace (docusate) - Pick up an over-the-counter form of Colace or another stool softener and take twice a day as long as you are requiring postoperative pain medications.  Take with a full  glass of water daily.  If you experience loose stools or diarrhea, hold the colace until you stool forms back up.  If your symptoms do not get better within 1 week or if they get worse, check with your doctor.  Dulcolax (bisacodyl) - Pick up over-the-counter and take as directed by the product packaging as needed to assist with the movement of your bowels.  Take with a full glass of water.  Use this product as needed if not relieved by Colace only.   MiraLax (polyethylene glycol) - Pick up over-the-counter to have on hand.  MiraLax is a solution that will increase the amount of water in your bowels to assist with bowel movements.  Take as directed and can mix with a glass of water, juice, soda, coffee, or tea.  Take if you go more than two days without a movement. Do not use MiraLax more than once per day. Call your doctor if you are still constipated or irregular after using this medication for 7 days in a row.  If you continue to have problems with postoperative constipation, please contact the office for further assistance and recommendations.  If you experience "the worst abdominal pain ever" or develop nausea or vomiting, please contact the office immediatly for further recommendations for treatment.  ITCHING  If you experience itching with your medications, try taking only a single pain pill, or even half a pain pill at a time.  You can also use Benadryl over the counter for itching or also to help with sleep.   TED HOSE STOCKINGS Wear the elastic stockings on both legs for three weeks following surgery during the day but you may remove then at night for sleeping.  MEDICATIONS See your medication summary on the "After Visit Summary" that the nursing staff will review with you prior to discharge.  You may have some home medications which will be placed on hold until you complete the course of blood thinner medication.  It is important for you to complete the blood thinner medication as  prescribed by your surgeon.  Continue your approved medications as instructed at time of discharge.  PRECAUTIONS If you experience chest pain or shortness of breath - call 911 immediately for transfer to the hospital emergency department.  If you develop a fever greater that 101 F, purulent drainage from wound, increased redness or drainage from wound, foul odor from the wound/dressing, or calf pain - CONTACT YOUR SURGEON.                                                   FOLLOW-UP APPOINTMENTS Make sure you keep all of your appointments after your operation with your surgeon  and caregivers. You should call the office at the above phone number and make an appointment for approximately two weeks after the date of your surgery or on the date instructed by your surgeon outlined in the "After Visit Summary".  RANGE OF MOTION AND STRENGTHENING EXERCISES  These exercises are designed to help you keep full movement of your hip joint. Follow your caregiver's or physical therapist's instructions. Perform all exercises about fifteen times, three times per day or as directed. Exercise both hips, even if you have had only one joint replacement. These exercises can be done on a training (exercise) mat, on the floor, on a table or on a bed. Use whatever works the best and is most comfortable for you. Use music or television while you are exercising so that the exercises are a pleasant break in your day. This will make your life better with the exercises acting as a break in routine you can look forward to.  Lying on your back, slowly slide your foot toward your buttocks, raising your knee up off the floor. Then slowly slide your foot back down until your leg is straight again.  Lying on your back spread your legs as far apart as you can without causing discomfort.  Lying on your side, raise your upper leg and foot straight up from the floor as far as is comfortable. Slowly lower the leg and repeat.  Lying on your  back, tighten up the muscle in the front of your thigh (quadriceps muscles). You can do this by keeping your leg straight and trying to raise your heel off the floor. This helps strengthen the largest muscle supporting your knee.  Lying on your back, tighten up the muscles of your buttocks both with the legs straight and with the knee bent at a comfortable angle while keeping your heel on the floor.   IF YOU ARE TRANSFERRED TO A SKILLED REHAB FACILITY If the patient is transferred to a skilled rehab facility following release from the hospital, a list of the current medications will be sent to the facility for the patient to continue.  When discharged from the skilled rehab facility, please have the facility set up the patient's [place] prior to being released. Also, the skilled facility will be responsible for providing the patient with their medications at time of release from the facility to include their pain medication, the muscle relaxants, and their blood thinner medication. If the patient is still at the rehab facility at time of the two week follow up appointment, the skilled rehab facility will also need to assist the patient in arranging follow up appointment in our office and any transportation needs.  MAKE SURE YOU:  Understand these instructions.  Get help right away if you are not doing well or get worse.    Pick up stool softner and laxative for home use following surgery while on pain medications. Do not submerge incision under water. Please use good hand washing techniques while changing dressing each day. May shower starting three days after surgery. Please use a clean towel to pat the incision dry following showers. Continue to use ice for pain and swelling after surgery. Do not use any lotions or creams on the incision until instructed by your surgeon.   Increase activity slowly as tolerated   Complete by:  As directed      Allergies as of 10/31/2017     No Known Allergies     Medication List  STOP taking these medications   HYDROcodone-acetaminophen 5-325 MG tablet Commonly known as:  NORCO   sulfamethoxazole-trimethoprim 800-160 MG tablet Commonly known as:  BACTRIM DS,SEPTRA DS     TAKE these medications   acetaminophen 500 MG tablet Commonly known as:  TYLENOL Take 1,000 mg by mouth every three (3) days as needed for moderate pain.   ALPRAZolam 0.25 MG tablet Commonly known as:  XANAX Take 0.25 mg by mouth at bedtime as needed for sleep.   aspirin 325 MG EC tablet Take 1 tablet (325 mg total) by mouth 2 (two) times daily. Take for three weeks to prevent blood clots   atorvastatin 20 MG tablet Commonly known as:  LIPITOR Take 20 mg by mouth daily.   cetirizine 10 MG tablet Commonly known as:  ZYRTEC Take 10 mg by mouth daily.   diphenhydrAMINE 25 MG tablet Commonly known as:  BENADRYL Take 25 mg by mouth at bedtime.   fluticasone 50 MCG/ACT nasal spray Commonly known as:  FLONASE Place 2 sprays into both nostrils daily.   ibuprofen 200 MG tablet Commonly known as:  ADVIL,MOTRIN Take 400 mg by mouth 2 (two) times daily as needed.   Melatonin 5 MG Tabs Take 5 mg by mouth at bedtime.   methocarbamol 500 MG tablet Commonly known as:  ROBAXIN Take 1 tablet (500 mg total) by mouth every 6 (six) hours as needed for muscle spasms.   multivitamin tablet Take 0.5 tablets by mouth 2 (two) times daily.   oxyCODONE 5 MG immediate release tablet Commonly known as:  Oxy IR/ROXICODONE Take 1-2 tablets (5-10 mg total) by mouth every 6 (six) hours as needed for moderate pain (pain score 4-6).   traMADol 50 MG tablet Commonly known as:  ULTRAM Take 1-2 tablets (50-100 mg total) by mouth every 6 (six) hours as needed for moderate pain (not relieved by oxycodone). What changed:    how much to take  when to take this  reasons to take this   vitamin B-12 500 MCG tablet Commonly known as:  CYANOCOBALAMIN Take  500 mcg by mouth daily.   Vitamin D 2000 units Caps Take 2,000 Units by mouth 2 (two) times daily.      Follow-up Information    Gaynelle Arabian, MD Follow up in 2 week(s).   Specialty:  Orthopedic Surgery Contact information: [address] [place] [address] [phone]           Signed: Ardeen Jourdain, PA-C Orthopaedic Surgery 11/05/2017, 11:34 AM

## 2017-12-03 DIAGNOSIS — Z96641 Presence of right artificial hip joint: Secondary | ICD-10-CM | POA: Diagnosis not present

## 2017-12-03 DIAGNOSIS — Z471 Aftercare following joint replacement surgery: Secondary | ICD-10-CM | POA: Diagnosis not present

## 2017-12-03 DIAGNOSIS — M25561 Pain in right knee: Secondary | ICD-10-CM | POA: Diagnosis not present

## 2017-12-04 DIAGNOSIS — C61 Malignant neoplasm of prostate: Secondary | ICD-10-CM | POA: Diagnosis not present

## 2017-12-11 DIAGNOSIS — N5201 Erectile dysfunction due to arterial insufficiency: Secondary | ICD-10-CM | POA: Diagnosis not present

## 2017-12-11 DIAGNOSIS — C61 Malignant neoplasm of prostate: Secondary | ICD-10-CM | POA: Diagnosis not present

## 2018-01-15 DIAGNOSIS — B078 Other viral warts: Secondary | ICD-10-CM | POA: Diagnosis not present

## 2018-01-31 DIAGNOSIS — M25561 Pain in right knee: Secondary | ICD-10-CM | POA: Diagnosis not present

## 2018-01-31 DIAGNOSIS — M19011 Primary osteoarthritis, right shoulder: Secondary | ICD-10-CM | POA: Diagnosis not present

## 2018-02-04 DIAGNOSIS — M25511 Pain in right shoulder: Secondary | ICD-10-CM | POA: Diagnosis not present

## 2018-02-13 DIAGNOSIS — S83241A Other tear of medial meniscus, current injury, right knee, initial encounter: Secondary | ICD-10-CM | POA: Diagnosis not present

## 2018-02-17 DIAGNOSIS — M19011 Primary osteoarthritis, right shoulder: Secondary | ICD-10-CM | POA: Diagnosis not present

## 2018-03-18 DIAGNOSIS — G8918 Other acute postprocedural pain: Secondary | ICD-10-CM | POA: Diagnosis not present

## 2018-03-18 DIAGNOSIS — M23321 Other meniscus derangements, posterior horn of medial meniscus, right knee: Secondary | ICD-10-CM | POA: Diagnosis not present

## 2018-03-18 DIAGNOSIS — M94261 Chondromalacia, right knee: Secondary | ICD-10-CM | POA: Diagnosis not present

## 2018-03-26 DIAGNOSIS — Z23 Encounter for immunization: Secondary | ICD-10-CM | POA: Diagnosis not present

## 2018-05-26 DIAGNOSIS — J209 Acute bronchitis, unspecified: Secondary | ICD-10-CM | POA: Diagnosis not present

## 2018-06-20 DIAGNOSIS — C61 Malignant neoplasm of prostate: Secondary | ICD-10-CM | POA: Diagnosis not present

## 2018-06-27 DIAGNOSIS — C61 Malignant neoplasm of prostate: Secondary | ICD-10-CM | POA: Diagnosis not present

## 2018-06-27 DIAGNOSIS — N5201 Erectile dysfunction due to arterial insufficiency: Secondary | ICD-10-CM | POA: Diagnosis not present

## 2018-07-03 DIAGNOSIS — D123 Benign neoplasm of transverse colon: Secondary | ICD-10-CM | POA: Diagnosis not present

## 2018-07-03 DIAGNOSIS — K5730 Diverticulosis of large intestine without perforation or abscess without bleeding: Secondary | ICD-10-CM | POA: Diagnosis not present

## 2018-07-03 DIAGNOSIS — Z86010 Personal history of colonic polyps: Secondary | ICD-10-CM | POA: Diagnosis not present

## 2018-07-03 DIAGNOSIS — D122 Benign neoplasm of ascending colon: Secondary | ICD-10-CM | POA: Diagnosis not present

## 2018-07-08 DIAGNOSIS — D123 Benign neoplasm of transverse colon: Secondary | ICD-10-CM | POA: Diagnosis not present

## 2018-07-08 DIAGNOSIS — D122 Benign neoplasm of ascending colon: Secondary | ICD-10-CM | POA: Diagnosis not present

## 2018-07-10 DIAGNOSIS — M25561 Pain in right knee: Secondary | ICD-10-CM | POA: Diagnosis not present

## 2018-07-10 DIAGNOSIS — M25551 Pain in right hip: Secondary | ICD-10-CM | POA: Diagnosis not present

## 2018-07-18 DIAGNOSIS — M25551 Pain in right hip: Secondary | ICD-10-CM | POA: Diagnosis not present

## 2018-07-21 DIAGNOSIS — M19011 Primary osteoarthritis, right shoulder: Secondary | ICD-10-CM | POA: Diagnosis not present

## 2018-08-01 DIAGNOSIS — S76011D Strain of muscle, fascia and tendon of right hip, subsequent encounter: Secondary | ICD-10-CM | POA: Diagnosis not present

## 2018-08-12 DIAGNOSIS — M7071 Other bursitis of hip, right hip: Secondary | ICD-10-CM | POA: Diagnosis not present

## 2018-11-12 DIAGNOSIS — Z8546 Personal history of malignant neoplasm of prostate: Secondary | ICD-10-CM | POA: Diagnosis not present

## 2018-11-12 DIAGNOSIS — E782 Mixed hyperlipidemia: Secondary | ICD-10-CM | POA: Diagnosis not present

## 2018-11-12 DIAGNOSIS — G4700 Insomnia, unspecified: Secondary | ICD-10-CM | POA: Diagnosis not present

## 2018-11-12 DIAGNOSIS — Z23 Encounter for immunization: Secondary | ICD-10-CM | POA: Diagnosis not present

## 2018-11-12 DIAGNOSIS — R7301 Impaired fasting glucose: Secondary | ICD-10-CM | POA: Diagnosis not present

## 2018-11-12 DIAGNOSIS — J309 Allergic rhinitis, unspecified: Secondary | ICD-10-CM | POA: Diagnosis not present

## 2018-11-12 DIAGNOSIS — Z0000 Encounter for general adult medical examination without abnormal findings: Secondary | ICD-10-CM | POA: Diagnosis not present

## 2018-11-12 DIAGNOSIS — Z1159 Encounter for screening for other viral diseases: Secondary | ICD-10-CM | POA: Diagnosis not present

## 2018-11-12 DIAGNOSIS — Z86010 Personal history of colonic polyps: Secondary | ICD-10-CM | POA: Diagnosis not present

## 2018-11-12 DIAGNOSIS — K439 Ventral hernia without obstruction or gangrene: Secondary | ICD-10-CM | POA: Diagnosis not present

## 2018-11-12 DIAGNOSIS — H6123 Impacted cerumen, bilateral: Secondary | ICD-10-CM | POA: Diagnosis not present

## 2018-11-12 DIAGNOSIS — Z79899 Other long term (current) drug therapy: Secondary | ICD-10-CM | POA: Diagnosis not present

## 2018-11-19 DIAGNOSIS — Z23 Encounter for immunization: Secondary | ICD-10-CM | POA: Diagnosis not present

## 2018-12-31 DIAGNOSIS — H9209 Otalgia, unspecified ear: Secondary | ICD-10-CM | POA: Diagnosis not present

## 2018-12-31 DIAGNOSIS — H9203 Otalgia, bilateral: Secondary | ICD-10-CM | POA: Diagnosis not present

## 2019-01-08 ENCOUNTER — Telehealth: Payer: Self-pay | Admitting: *Deleted

## 2019-01-08 DIAGNOSIS — H6982 Other specified disorders of Eustachian tube, left ear: Secondary | ICD-10-CM | POA: Diagnosis not present

## 2019-01-08 NOTE — Telephone Encounter (Signed)
Records faxed to [facility] - Release [id number]

## 2019-01-13 DIAGNOSIS — H9122 Sudden idiopathic hearing loss, left ear: Secondary | ICD-10-CM | POA: Diagnosis not present

## 2019-01-13 DIAGNOSIS — H9312 Tinnitus, left ear: Secondary | ICD-10-CM | POA: Diagnosis not present

## 2019-01-13 DIAGNOSIS — H9042 Sensorineural hearing loss, unilateral, left ear, with unrestricted hearing on the contralateral side: Secondary | ICD-10-CM | POA: Diagnosis not present

## 2019-01-23 DIAGNOSIS — C61 Malignant neoplasm of prostate: Secondary | ICD-10-CM | POA: Diagnosis not present

## 2019-01-26 DIAGNOSIS — H9312 Tinnitus, left ear: Secondary | ICD-10-CM | POA: Diagnosis not present

## 2019-01-26 DIAGNOSIS — H9122 Sudden idiopathic hearing loss, left ear: Secondary | ICD-10-CM | POA: Diagnosis not present

## 2019-01-26 DIAGNOSIS — H9042 Sensorineural hearing loss, unilateral, left ear, with unrestricted hearing on the contralateral side: Secondary | ICD-10-CM | POA: Diagnosis not present

## 2019-01-30 DIAGNOSIS — C61 Malignant neoplasm of prostate: Secondary | ICD-10-CM | POA: Diagnosis not present

## 2019-02-06 DIAGNOSIS — R5381 Other malaise: Secondary | ICD-10-CM | POA: Diagnosis not present

## 2019-02-12 DIAGNOSIS — H9122 Sudden idiopathic hearing loss, left ear: Secondary | ICD-10-CM | POA: Diagnosis not present

## 2019-02-12 DIAGNOSIS — H9312 Tinnitus, left ear: Secondary | ICD-10-CM | POA: Diagnosis not present

## 2019-02-12 DIAGNOSIS — H9042 Sensorineural hearing loss, unilateral, left ear, with unrestricted hearing on the contralateral side: Secondary | ICD-10-CM | POA: Diagnosis not present

## 2019-02-13 DIAGNOSIS — H0014 Chalazion left upper eyelid: Secondary | ICD-10-CM | POA: Diagnosis not present

## 2019-02-17 ENCOUNTER — Encounter: Payer: Self-pay | Admitting: Radiation Oncology

## 2019-02-17 ENCOUNTER — Ambulatory Visit
Admission: RE | Admit: 2019-02-17 | Discharge: 2019-02-17 | Disposition: A | Discharge status: Home / Self Care | Payer: Medicare Other | Source: Ambulatory Visit | Attending: Radiation Oncology | Admitting: Radiation Oncology

## 2019-02-17 ENCOUNTER — Other Ambulatory Visit: Payer: Self-pay

## 2019-02-17 VITALS — Ht 67.0 in | Wt 220.0 lb

## 2019-02-17 DIAGNOSIS — Z9079 Acquired absence of other genital organ(s): Secondary | ICD-10-CM | POA: Diagnosis not present

## 2019-02-17 DIAGNOSIS — R9721 Rising PSA following treatment for malignant neoplasm of prostate: Secondary | ICD-10-CM | POA: Diagnosis not present

## 2019-02-17 DIAGNOSIS — C61 Malignant neoplasm of prostate: Secondary | ICD-10-CM

## 2019-02-17 DIAGNOSIS — Z8579 Personal history of other malignant neoplasms of lymphoid, hematopoietic and related tissues: Secondary | ICD-10-CM | POA: Diagnosis not present

## 2019-02-17 NOTE — Progress Notes (Signed)
Radiation Oncology         (336) [phone] ________________________________  Initial outpatient Consultation - Conducted via Telephone due to current COVID-19 concerns for limiting patient exposure  Name: Ian Park MRN: [id number]  Date: 02/17/2019  DOB: 12-23-1950  MP:NTIRWE, [name], MD  Raynelle Bring, MD   REFERRING PHYSICIAN: Raynelle Bring, MD  DIAGNOSIS: 68 y.o. gentleman with a rising, detectable PSA of 0.119, s/p RALP for Stage pT3a, pN0, Gleason 4+3, and PSA of 6.57.    ICD-10-CM   1. Malignant neoplasm of prostate ([place])  C61   2. Prostate cancer [facility])  C61     HISTORY OF PRESENT ILLNESS: [name] is a 68 y.o. male with a diagnosis of locally advanced prostate cancer. 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 Alen Blew. He was initially referred to Dr. [name] after he was noted to have an elevated PSA of 4.64 on 02/2010. He was treated with antibiotics at that time, and the PSA dropped to 2.81 in 04/2010. It rose to 5.89 in 07/2013 and again to 6.57 in 08/2015. He underwent biopsy on 09/21/2015, which showed Gleason 4+3 in 3 cores.   He opted to proceed with UNS RALP and BPLND on 12/22/2015. Pathology from the procedure showed: Gleason 4+3 adenocarcinoma; negative margins; extraprostatic extension at right posterolateral mid; lymphovascular invasion; negative seminal vesicles; negative lymph nodes (0/9). His PSA remained undetectable until 12/04/2017, when it was 0.027. Since then, it rose to 0.119 on 01/27/2019.  The patient reviewed the biopsy results with his urologist and he has kindly been referred today for discussion of potential salvage radiation treatment options.   PREVIOUS RADIATION THERAPY: No  PAST MEDICAL HISTORY:  Past Medical History:  Diagnosis Date  . Arthritis    oa, hips and right shoulder  . Birthmark    right leg portwine stain birthmark  . Cancer ([place])    lymphoma- tx.  last with chemo 2011-currently remission-Dr. Keene Breath  . Complication of anesthesia    "slow to wake up and PEE"  . Headache    occ. stress related  . History of chemotherapy 2011   none currently  . History of kidney stones    x1 episode. Bladder stone at present  . Hx of seasonal allergies   . Hypercholesterolemia   . Large B-cell lymphoma ([place]) 09/10/2009   Nasopharyngeal  . Prostate cancer ([place])   . Shin injury    red area no drainage pt says present for last 30 years      PAST SURGICAL HISTORY: Past Surgical History:  Procedure Laterality Date  . KNEE ARTHROSCOPY  1982   Right knee  . LYMPHADENECTOMY Bilateral 12/22/2015   Procedure: BILATERAL PELVIC LYMPHADENECTOMY;  Surgeon: Raynelle Bring, MD;  Location: [facility];  Service: Urology;  Laterality: Bilateral;  . ROBOT ASSISTED LAPAROSCOPIC RADICAL PROSTATECTOMY N/A 12/22/2015   Procedure: XI ROBOTIC ASSISTED LAPAROSCOPIC RADICAL PROSTATECTOMY LEVEL 3, AND REMOVAL OF BLADDER CALCULUS;  Surgeon: Raynelle Bring, MD;  Location: [facility];  Service: Urology;  Laterality: N/A;  . ROTATOR CUFF REPAIR  1997   Right shoulder   . SHOULDER ARTHROSCOPY  1986   Right shoulder  . SHOULDER ARTHROSCOPY  1993   Left shoulder Rotator cuff repair  . TOTAL HIP ARTHROPLASTY Left 05/30/2015   Procedure: LEFT TOTAL HIP ARTHROPLASTY ANTERIOR APPROACH;  Surgeon: Gaynelle Arabian, MD;  Location: [facility];  Service: Orthopedics;  Laterality: Left;  . TOTAL  HIP ARTHROPLASTY Right 10/30/2017   Procedure: RIGHT TOTAL HIP ARTHROPLASTY ANTERIOR APPROACH;  Surgeon: Gaynelle Arabian, MD;  Location: [facility];  Service: Orthopedics;  Laterality: Right;    FAMILY HISTORY:  Family History  Problem Relation Age of Onset  . Cataracts Mother   . Heart attack Father   . Cholelithiasis Brother   . Cancer Neg Hx   . Prostate cancer Neg Hx   . Colon cancer Neg Hx   . Breast cancer Neg Hx     SOCIAL HISTORY:  Social History   Socioeconomic History  . Marital status: Widowed     Spouse name: Not on file  . Number of children: Not on file  . Years of education: Not on file  . Highest education level: Not on file  Occupational History  . Occupation: Works at [employer] in [place], [place]: [place]  . Financial resource strain: Not on file  . Food insecurity    Worry: Not on file    Inability: Not on file  . Transportation needs    Medical: Not on file    Non-medical: Not on file  Tobacco Use  . Smoking status: Former Smoker    Packs/day: 1.00    Years: 10.00    Pack years: 10.00    Types: Cigars, Pipe    Quit date: 06/11/2004    Years since quitting: 14.7  . Smokeless tobacco: Never Used  Substance and Sexual Activity  . Alcohol use: Yes    Alcohol/week: 5.0 standard drinks    Types: 5 Shots of liquor per week    Comment: 1 drink per day  . Drug use: No  . Sexual activity: Not Currently  Lifestyle  . Physical activity    Days per week: Not on file    Minutes per session: Not on file  . Stress: Not on file  Relationships  . Social Herbalist on phone: Not on file    Gets together: Not on file    Attends religious service: Not on file    Active member of club or organization: Not on file    Attends meetings of clubs or organizations: Not on file    Relationship status: Not on file  . Intimate partner violence    Fear of current or ex partner: Not on file    Emotionally abused: Not on file    Physically abused: Not on file    Forced sexual activity: Not on file  Other Topics Concern  . Not on file  Social History Narrative  . Not on file    ALLERGIES: Patient has no known allergies.  MEDICATIONS:  Current Outpatient Medications  Medication Sig Dispense Refill  . acetaminophen (TYLENOL) 500 MG tablet Take 1,000 mg by mouth every three (3) days as needed for moderate pain.    [name] ALPRAZolam (XANAX) 0.25 MG tablet Take 0.25 mg by mouth at bedtime as needed for sleep.     [name] aspirin EC 325 MG EC  tablet Take 1 tablet (325 mg total) by mouth 2 (two) times daily. Take for three weeks to prevent blood clots 40 tablet 0  . atorvastatin (LIPITOR) 20 MG tablet Take 20 mg by mouth daily.      . Azelastine HCl 0.15 % SOLN 1SPRAY IN EACH NOSTRIL TWICE A DAY NASALLY 30 DAY(S)    . buPROPion (WELLBUTRIN XL) 150 MG 24 hr tablet TAKE 1 TABLET BY MOUTH EVERY  DAY IN THE MORNING    . cetirizine (ZYRTEC) 10 MG tablet Take 10 mg by mouth daily.      . Cholecalciferol (VITAMIN D) 2000 units CAPS Take 2,000 Units by mouth 2 (two) times daily.    . cyclobenzaprine (FLEXERIL) 10 MG tablet cyclobenzaprine 10 mg tablet  TAKE 1 TABLET BY MOUTH THREE TIMES A DAY AS NEEDED    . desonide (DESOWEN) 0.05 % cream desonide 0.05 % topical cream  APPLY TO FACE UP TO TWICE A DAY AS NEEDED    . diphenhydrAMINE (BENADRYL) 25 MG tablet Take 25 mg by mouth at bedtime.     [name] doxycycline (VIBRA-TABS) 100 MG tablet     . fluticasone (FLONASE) 50 MCG/ACT nasal spray Place 2 sprays into both nostrils daily.    [name] ibuprofen (ADVIL,MOTRIN) 200 MG tablet Take 400 mg by mouth 2 (two) times daily as needed.    . Melatonin 5 MG TABS Take 5 mg by mouth at bedtime.    . meloxicam (MOBIC) 15 MG tablet TAKE 1 TABLET BY MOUTH ONCE A DAY FOR TWO WEEKS, AND THEN ONLY AS NEEDED AFTERWARDS.    [name] methocarbamol (ROBAXIN) 500 MG tablet Take 1 tablet (500 mg total) by mouth every 6 (six) hours as needed for muscle spasms. 40 tablet 0  . Multiple Vitamin (MULTIVITAMIN) tablet Take 0.5 tablets by mouth 2 (two) times daily.    [name] oxyCODONE (OXY IR/ROXICODONE) 5 MG immediate release tablet Take 1-2 tablets (5-10 mg total) by mouth every 6 (six) hours as needed for moderate pain (pain score 4-6). 56 tablet 0  . predniSONE (DELTASONE) 20 MG tablet TAKE 1 TABLET (20 MG TOTAL) BY MOUTH 3X DAILY WITH FOOD FOR 14 DAYS.    [name] tadalafil (CIALIS) 5 MG tablet Cialis 5 mg tablet  TAKE 1 TABLET BY MOUTH EVERY DAY    . traMADol (ULTRAM) 50 MG tablet Take 1-2 tablets  (50-100 mg total) by mouth every 6 (six) hours as needed for moderate pain (not relieved by oxycodone). 40 tablet 0  . traZODone (DESYREL) 100 MG tablet TAKE 1 TABLET BY MOUTH AT BEDTIME WHEN NECESSARY FOR INSOMNIA    . vitamin B-12 (CYANOCOBALAMIN) 500 MCG tablet Take 500 mcg by mouth daily.    [name] Zoster Vaccine Adjuvanted [facility]) injection Shingrix (PF) 50 mcg/0.5 mL intramuscular suspension, kit     No current facility-administered medications for this encounter.     REVIEW OF SYSTEMS:  On review of systems, the patient reports that he is doing well overall. He denies any chest pain, shortness of breath, cough, fevers, chills, night sweats, unintended weight changes. He denies any bowel disturbances, and denies abdominal pain, nausea or vomiting. He denies any new musculoskeletal or joint aches or pains. His IPSS was 2, indicating very mild urinary symptoms. He reports stable continence, noting rare scant leakage with exercise. His SHIM was 2, indicating he has severe erectile dysfunction. He takes sildenafil for this. A complete review of systems is obtained and is otherwise negative.    PHYSICAL EXAM:  Wt Readings from Last 3 Encounters:  02/17/19 220 lb (99.8 kg)  10/30/17 221 lb (100.2 kg)  10/23/17 221 lb (100.2 kg)   Temp Readings from Last 3 Encounters:  10/31/17 98 F (36.7 C) (Oral)  10/23/17 98.4 F (36.9 C) (Oral)  12/23/15 99 F (37.2 C) (Oral)   BP Readings from Last 3 Encounters:  10/31/17 131/77  10/23/17 (!) 148/79  12/23/15 (!) 106/56   Pulse Readings from  Last 3 Encounters:  10/31/17 64  10/23/17 74  12/23/15 75   Pain Assessment Pain Score: 0-No pain(reports soreness in right knee s/p knee surgery last fall. played golf this morning.)/10  In general this is a well appearing man in no acute distress. He's alert and oriented x4 and appropriate throughout the examination. Cardiopulmonary assessment is negative for acute distress and he exhibits normal  effort.    KPS = 100  100 - Normal; no complaints; no evidence of disease. 90   - Able to carry on normal activity; minor signs or symptoms of disease. 80   - Normal activity with effort; some signs or symptoms of disease. 47   - Cares for self; unable to carry on normal activity or to do active work. 60   - Requires occasional assistance, but is able to care for most of his personal needs. 50   - Requires considerable assistance and frequent medical care. 25   - Disabled; requires special care and assistance. 73   - Severely disabled; hospital admission is indicated although death not imminent. 66   - Very sick; hospital admission necessary; active supportive treatment necessary. 10   - Moribund; fatal processes progressing rapidly. 0     - Dead  Karnofsky DA, Abelmann Latham, Craver LS and Burchenal [facility] [phone]) The use of the nitrogen mustards in the palliative treatment of carcinoma: with particular reference to bronchogenic carcinoma Cancer 1 634-56  LABORATORY DATA:  Lab Results  Component Value Date   WBC 12.8 (H) 10/31/2017   HGB 12.5 (L) 10/31/2017   HCT 37.5 (L) 10/31/2017   MCV 93.1 10/31/2017   PLT 258 10/31/2017   Lab Results  Component Value Date   NA 136 10/31/2017   K 4.7 10/31/2017   CL 104 10/31/2017   CO2 24 10/31/2017   Lab Results  Component Value Date   ALT 23 10/23/2017   AST 23 10/23/2017   ALKPHOS 55 10/23/2017   BILITOT 0.6 10/23/2017     RADIOGRAPHY: No results found.    IMPRESSION/PLAN: 1. 68 y.o. gentleman with a rising, detectable PSA of 0.119, s/p RALP for Stage pT3a, pN0, Gleason 4+3, and PSA of 6.57.  Today we reviewed the findings and workup thus far.  We discussed the natural history of prostate cancer.  We reviewed the the implications of positive margins, extracapsular extension, and seminal vesicle involvement on the risk of prostate cancer recurrence. We discussed the recommendation for a 7-1/2-week course of daily radiotherapy directed  to the prostatic fossa with regard to the logistics and delivery of external beam radiation treatment.  We discussed the risks and benefits as well as acute and late side effects to be expected with radiotherapy in the setting.  He was encouraged to ask questions were answered to his stated satisfaction.  At the end of the conversation the patient is interested in moving forward with 7.5 weeks of salvage external beam therapy. He has given verbal consent to proceed and is scheduled for CT simulation this Friday, 02/20/2019, at 9 am.  We will obtain formal written consent at the time of his CT simulation and a copy of this document will be placed in his medical record.  He appears to have a good understanding of our recommendations and is comfortable and in agreement with the stated plan. We will share our discussion with Dr. [name] and move forward with treatment planning in anticipation of beginning IMRT in the near future.  Given current concerns for patient  exposure during the COVID-19 pandemic, this encounter was conducted via telephone. The patient was notified in advance and was offered a MyChart meeting to allow for face to face communication but unfortunately reported that he did not have the appropriate resources/technology to support such a visit and instead preferred to proceed with telephone consult. The patient has given verbal consent for this type of encounter. The time spent during this encounter was 55 minutes. The attendants for this meeting include Tyler Pita MD, Ashlyn Bruning PA-C, [place], and patient, Bernd Crom. During the encounter, Tyler Pita MD, Ashlyn Bruning PA-C, and scribe, Wilburn Mylar were located at [facility].  Patient, Karmello Abercrombie was located at home.    Nicholos Johns, PA-C    Tyler Pita, MD  [facility] Direct Dial: [phone]  Fax: [phone]  [email or web address]  Skype  [email or web address]  This document serves as a record of services personally performed by Tyler Pita, MD and Freeman Caldron, PA-C. It was created on their behalf by Wilburn Mylar, a trained medical scribe. The creation of this record is based on the scribe's personal observations and the provider's statements to them. This document has been checked and approved by the attending provider.

## 2019-02-17 NOTE — Progress Notes (Signed)
See progress note under physician encounter. 

## 2019-02-17 NOTE — Progress Notes (Signed)
GU Location of Tumor / Histology: Prostatic adenocarcinoma  If Prostate Cancer, Gleason Score is (4 + 3) and PSA was (6.57) pretreatment  Ian Park had a prostatectomy in 2017. Surgical margins were clear. Rising PSA concerning for biochemical recurrence.     Past/Anticipated interventions by urology, if any: prostate biopsy, prostatectomy, surveillance, referral to Dr. Tammi Park to discuss radiotherapy options  Past/Anticipated interventions by medical oncology, if any: no  Weight changes, if any: no  Bowel/Bladder complaints, if any: IPSS 2. SHIM 2. Stable continence. Reports rare scant leakage with exercise. Denies dysuria or hematuria. Denies bowel complaints.   Nausea/Vomiting, if any: no  Pain issues, if any:  Reports soreness in right knee. Explains he had knee surgery last Fall. Denies any new pain.   SAFETY ISSUES:  Prior radiation? No. Patient does have a history of chemotherapy from non hodgkin's lymphoma in 2011.  Pacemaker/ICD? no  Possible current pregnancy? no, male patient  Is the patient on methotrexate? no  Current Complaints / other details:  68 year old male. Widowed. Reports he has a girlfriend but COVID has made it difficult to see her. Has 2 new grandchildren. Per [facility]'s note patient is most concerned about longevity more so than quality.   Patient anxious to get started with radiation therapy if Dr. Tammi Park thinks it will help![name]

## 2019-02-20 ENCOUNTER — Other Ambulatory Visit: Payer: Self-pay

## 2019-02-20 ENCOUNTER — Ambulatory Visit
Admission: RE | Admit: 2019-02-20 | Discharge: 2019-02-20 | Disposition: A | Discharge status: Home / Self Care | Payer: Medicare Other | Source: Ambulatory Visit | Attending: Radiation Oncology | Admitting: Radiation Oncology

## 2019-02-20 DIAGNOSIS — Z510 Encounter for antineoplastic radiation therapy: Secondary | ICD-10-CM | POA: Diagnosis not present

## 2019-02-20 DIAGNOSIS — C61 Malignant neoplasm of prostate: Secondary | ICD-10-CM | POA: Insufficient documentation

## 2019-02-20 NOTE — Progress Notes (Signed)
  Radiation Oncology         [phone] ________________________________  Name: Ian Park MRN: [id number]  Date: 02/20/2019  DOB: 01/24/1951  SIMULATION AND TREATMENT PLANNING NOTE    ICD-10-CM   1. Prostate cancer ([place])  C61     DIAGNOSIS:  68 y.o. gentleman with a rising, detectable PSA of 0.119, s/p RALP for Stage pT3a, pN0, Gleason 4+3, and PSA of 6.57.  NARRATIVE:  The patient was brought to the [facility].  Identity was confirmed.  All relevant records and images related to the planned course of therapy were reviewed.  The patient freely provided informed written consent to proceed with treatment after reviewing the details related to the planned course of therapy. The consent form was witnessed and verified by the simulation staff.  Then, the patient was set-up in a stable reproducible supine position for radiation therapy.  A vacuum lock pillow device was custom fabricated to position his legs in a reproducible immobilized position.  Then, I performed a urethrogram under sterile conditions to identify the prostatic apex.  CT images were obtained.  Surface markings were placed.  The CT images were loaded into the planning software.  Then the prostate target and avoidance structures including the rectum, bladder, bowel and hips were contoured.  Treatment planning then occurred.  The radiation prescription was entered and confirmed.  A total of one complex treatment devices was fabricated. I have requested : Intensity Modulated Radiotherapy (IMRT) is medically necessary for this case for the following reason:  Rectal sparing.[name]  PLAN:  The patient will receive 68.4 Gy in 38 fractions.  ________________________________  Sheral Apley Tammi Klippel, M.D.  This document serves as a record of services personally performed by Tyler Pita, MD. It was created on his behalf by Wilburn Mylar, a trained medical scribe. The creation of this record is based on the scribe's personal  observations and the provider's statements to them. This document has been checked and approved by the attending provider.

## 2019-02-26 ENCOUNTER — Telehealth: Payer: Self-pay | Admitting: Radiation Oncology

## 2019-02-26 ENCOUNTER — Telehealth: Payer: Self-pay | Admitting: Medical Oncology

## 2019-02-26 NOTE — Telephone Encounter (Signed)
Received notification from the after hours nurse that this patient called and request a call back. Phoned patient to inquire. Patient request this RN review his medication list and let him know if there are any medications he needs to stop before starting radiation therapy. Explained high doses of vitamin A, C, and E are discouraged since they can effect the absorption of radiation therapy. Patient questions if 500 of vitamin C daily is OK and I explained it is. Explained B and D are OK as well. Patient verbalized understanding of all reviewed and expressed appreciation for the return call.

## 2019-02-26 NOTE — Telephone Encounter (Signed)
Called patient to introduce myself as the prostate nurse navigator and discuss my role. I was unable to meet him the day he consulted with Dr. Tammi Klippel. He had robotic prostatectomy in 2017 and now with rising PSA. He has decided to move forward with radiation. He had CT simulation 9/11 and will begin radiation 03/03/19. I gave him my office number and asked him to call me with questions or concerns.

## 2019-03-02 DIAGNOSIS — Z510 Encounter for antineoplastic radiation therapy: Secondary | ICD-10-CM | POA: Diagnosis not present

## 2019-03-02 DIAGNOSIS — C61 Malignant neoplasm of prostate: Secondary | ICD-10-CM | POA: Diagnosis not present

## 2019-03-03 ENCOUNTER — Ambulatory Visit
Admission: RE | Admit: 2019-03-03 | Discharge: 2019-03-03 | Disposition: A | Discharge status: Home / Self Care | Payer: Medicare Other | Source: Ambulatory Visit | Attending: Radiation Oncology | Admitting: Radiation Oncology

## 2019-03-03 ENCOUNTER — Other Ambulatory Visit: Payer: Self-pay

## 2019-03-03 DIAGNOSIS — Z510 Encounter for antineoplastic radiation therapy: Secondary | ICD-10-CM | POA: Diagnosis not present

## 2019-03-03 DIAGNOSIS — C61 Malignant neoplasm of prostate: Secondary | ICD-10-CM | POA: Diagnosis not present

## 2019-03-04 ENCOUNTER — Other Ambulatory Visit: Payer: Self-pay

## 2019-03-04 ENCOUNTER — Ambulatory Visit
Admission: RE | Admit: 2019-03-04 | Discharge: 2019-03-04 | Disposition: A | Discharge status: Home / Self Care | Payer: Medicare Other | Source: Ambulatory Visit | Attending: Radiation Oncology | Admitting: Radiation Oncology

## 2019-03-04 DIAGNOSIS — C61 Malignant neoplasm of prostate: Secondary | ICD-10-CM | POA: Diagnosis not present

## 2019-03-04 DIAGNOSIS — Z510 Encounter for antineoplastic radiation therapy: Secondary | ICD-10-CM | POA: Diagnosis not present

## 2019-03-05 ENCOUNTER — Other Ambulatory Visit: Payer: Self-pay

## 2019-03-05 ENCOUNTER — Ambulatory Visit
Admission: RE | Admit: 2019-03-05 | Discharge: 2019-03-05 | Disposition: A | Discharge status: Home / Self Care | Payer: Medicare Other | Source: Ambulatory Visit | Attending: Radiation Oncology | Admitting: Radiation Oncology

## 2019-03-05 DIAGNOSIS — Z510 Encounter for antineoplastic radiation therapy: Secondary | ICD-10-CM | POA: Diagnosis not present

## 2019-03-05 DIAGNOSIS — C61 Malignant neoplasm of prostate: Secondary | ICD-10-CM | POA: Diagnosis not present

## 2019-03-06 ENCOUNTER — Other Ambulatory Visit: Payer: Self-pay

## 2019-03-06 ENCOUNTER — Ambulatory Visit
Admission: RE | Admit: 2019-03-06 | Discharge: 2019-03-06 | Disposition: A | Discharge status: Home / Self Care | Payer: Medicare Other | Source: Ambulatory Visit | Attending: Radiation Oncology | Admitting: Radiation Oncology

## 2019-03-06 DIAGNOSIS — Z510 Encounter for antineoplastic radiation therapy: Secondary | ICD-10-CM | POA: Diagnosis not present

## 2019-03-06 DIAGNOSIS — R5381 Other malaise: Secondary | ICD-10-CM | POA: Diagnosis not present

## 2019-03-06 DIAGNOSIS — C61 Malignant neoplasm of prostate: Secondary | ICD-10-CM | POA: Diagnosis not present

## 2019-03-09 ENCOUNTER — Other Ambulatory Visit: Payer: Self-pay

## 2019-03-09 ENCOUNTER — Ambulatory Visit
Admission: RE | Admit: 2019-03-09 | Discharge: 2019-03-09 | Disposition: A | Discharge status: Home / Self Care | Payer: Medicare Other | Source: Ambulatory Visit | Attending: Radiation Oncology | Admitting: Radiation Oncology

## 2019-03-09 DIAGNOSIS — Z510 Encounter for antineoplastic radiation therapy: Secondary | ICD-10-CM | POA: Diagnosis not present

## 2019-03-09 DIAGNOSIS — C61 Malignant neoplasm of prostate: Secondary | ICD-10-CM | POA: Diagnosis not present

## 2019-03-10 ENCOUNTER — Other Ambulatory Visit: Payer: Self-pay

## 2019-03-10 ENCOUNTER — Ambulatory Visit
Admission: RE | Admit: 2019-03-10 | Discharge: 2019-03-10 | Disposition: A | Discharge status: Home / Self Care | Payer: Medicare Other | Source: Ambulatory Visit | Attending: Radiation Oncology | Admitting: Radiation Oncology

## 2019-03-10 DIAGNOSIS — C61 Malignant neoplasm of prostate: Secondary | ICD-10-CM | POA: Diagnosis not present

## 2019-03-10 DIAGNOSIS — Z510 Encounter for antineoplastic radiation therapy: Secondary | ICD-10-CM | POA: Diagnosis not present

## 2019-03-11 ENCOUNTER — Other Ambulatory Visit: Payer: Self-pay

## 2019-03-11 ENCOUNTER — Ambulatory Visit
Admission: RE | Admit: 2019-03-11 | Discharge: 2019-03-11 | Disposition: A | Discharge status: Home / Self Care | Payer: Medicare Other | Source: Ambulatory Visit | Attending: Radiation Oncology | Admitting: Radiation Oncology

## 2019-03-11 DIAGNOSIS — Z510 Encounter for antineoplastic radiation therapy: Secondary | ICD-10-CM | POA: Diagnosis not present

## 2019-03-11 DIAGNOSIS — C61 Malignant neoplasm of prostate: Secondary | ICD-10-CM | POA: Diagnosis not present

## 2019-03-12 ENCOUNTER — Ambulatory Visit
Admission: RE | Admit: 2019-03-12 | Discharge: 2019-03-12 | Disposition: A | Discharge status: Home / Self Care | Payer: Medicare Other | Source: Ambulatory Visit | Attending: Radiation Oncology | Admitting: Radiation Oncology

## 2019-03-12 ENCOUNTER — Other Ambulatory Visit: Payer: Self-pay

## 2019-03-12 DIAGNOSIS — Z510 Encounter for antineoplastic radiation therapy: Secondary | ICD-10-CM | POA: Insufficient documentation

## 2019-03-12 DIAGNOSIS — C61 Malignant neoplasm of prostate: Secondary | ICD-10-CM | POA: Insufficient documentation

## 2019-03-13 ENCOUNTER — Other Ambulatory Visit: Payer: Self-pay

## 2019-03-13 ENCOUNTER — Ambulatory Visit
Admission: RE | Admit: 2019-03-13 | Discharge: 2019-03-13 | Disposition: A | Discharge status: Home / Self Care | Payer: Medicare Other | Source: Ambulatory Visit | Attending: Radiation Oncology | Admitting: Radiation Oncology

## 2019-03-13 DIAGNOSIS — Z510 Encounter for antineoplastic radiation therapy: Secondary | ICD-10-CM | POA: Diagnosis not present

## 2019-03-13 DIAGNOSIS — C61 Malignant neoplasm of prostate: Secondary | ICD-10-CM | POA: Diagnosis not present

## 2019-03-16 ENCOUNTER — Ambulatory Visit
Admission: RE | Admit: 2019-03-16 | Discharge: 2019-03-16 | Disposition: A | Discharge status: Home / Self Care | Payer: Medicare Other | Source: Ambulatory Visit | Attending: Radiation Oncology | Admitting: Radiation Oncology

## 2019-03-16 ENCOUNTER — Other Ambulatory Visit: Payer: Self-pay

## 2019-03-16 DIAGNOSIS — Z510 Encounter for antineoplastic radiation therapy: Secondary | ICD-10-CM | POA: Diagnosis not present

## 2019-03-16 DIAGNOSIS — C61 Malignant neoplasm of prostate: Secondary | ICD-10-CM | POA: Diagnosis not present

## 2019-03-17 ENCOUNTER — Other Ambulatory Visit: Payer: Self-pay

## 2019-03-17 ENCOUNTER — Ambulatory Visit
Admission: RE | Admit: 2019-03-17 | Discharge: 2019-03-17 | Disposition: A | Discharge status: Home / Self Care | Payer: Medicare Other | Source: Ambulatory Visit | Attending: Radiation Oncology | Admitting: Radiation Oncology

## 2019-03-17 DIAGNOSIS — Z510 Encounter for antineoplastic radiation therapy: Secondary | ICD-10-CM | POA: Diagnosis not present

## 2019-03-17 DIAGNOSIS — C61 Malignant neoplasm of prostate: Secondary | ICD-10-CM | POA: Diagnosis not present

## 2019-03-18 ENCOUNTER — Ambulatory Visit
Admission: RE | Admit: 2019-03-18 | Discharge: 2019-03-18 | Disposition: A | Discharge status: Home / Self Care | Payer: Medicare Other | Source: Ambulatory Visit | Attending: Radiation Oncology | Admitting: Radiation Oncology

## 2019-03-18 ENCOUNTER — Other Ambulatory Visit: Payer: Self-pay

## 2019-03-18 DIAGNOSIS — Z510 Encounter for antineoplastic radiation therapy: Secondary | ICD-10-CM | POA: Diagnosis not present

## 2019-03-18 DIAGNOSIS — Z23 Encounter for immunization: Secondary | ICD-10-CM | POA: Diagnosis not present

## 2019-03-18 DIAGNOSIS — C61 Malignant neoplasm of prostate: Secondary | ICD-10-CM | POA: Diagnosis not present

## 2019-03-19 ENCOUNTER — Ambulatory Visit
Admission: RE | Admit: 2019-03-19 | Discharge: 2019-03-19 | Disposition: A | Discharge status: Home / Self Care | Payer: Medicare Other | Source: Ambulatory Visit | Attending: Radiation Oncology | Admitting: Radiation Oncology

## 2019-03-19 ENCOUNTER — Other Ambulatory Visit: Payer: Self-pay

## 2019-03-19 DIAGNOSIS — Z510 Encounter for antineoplastic radiation therapy: Secondary | ICD-10-CM | POA: Diagnosis not present

## 2019-03-19 DIAGNOSIS — C61 Malignant neoplasm of prostate: Secondary | ICD-10-CM | POA: Diagnosis not present

## 2019-03-20 ENCOUNTER — Ambulatory Visit
Admission: RE | Admit: 2019-03-20 | Discharge: 2019-03-20 | Disposition: A | Discharge status: Home / Self Care | Payer: Medicare Other | Source: Ambulatory Visit | Attending: Radiation Oncology | Admitting: Radiation Oncology

## 2019-03-20 ENCOUNTER — Other Ambulatory Visit: Payer: Self-pay

## 2019-03-20 DIAGNOSIS — Z510 Encounter for antineoplastic radiation therapy: Secondary | ICD-10-CM | POA: Diagnosis not present

## 2019-03-20 DIAGNOSIS — C61 Malignant neoplasm of prostate: Secondary | ICD-10-CM | POA: Diagnosis not present

## 2019-03-23 ENCOUNTER — Other Ambulatory Visit: Payer: Self-pay

## 2019-03-23 ENCOUNTER — Ambulatory Visit
Admission: RE | Admit: 2019-03-23 | Discharge: 2019-03-23 | Disposition: A | Discharge status: Home / Self Care | Payer: Medicare Other | Source: Ambulatory Visit | Attending: Radiation Oncology | Admitting: Radiation Oncology

## 2019-03-23 DIAGNOSIS — C61 Malignant neoplasm of prostate: Secondary | ICD-10-CM | POA: Diagnosis not present

## 2019-03-23 DIAGNOSIS — Z510 Encounter for antineoplastic radiation therapy: Secondary | ICD-10-CM | POA: Diagnosis not present

## 2019-03-24 ENCOUNTER — Ambulatory Visit
Admission: RE | Admit: 2019-03-24 | Discharge: 2019-03-24 | Disposition: A | Discharge status: Home / Self Care | Payer: Medicare Other | Source: Ambulatory Visit | Attending: Radiation Oncology | Admitting: Radiation Oncology

## 2019-03-24 ENCOUNTER — Other Ambulatory Visit: Payer: Self-pay

## 2019-03-24 DIAGNOSIS — Z510 Encounter for antineoplastic radiation therapy: Secondary | ICD-10-CM | POA: Diagnosis not present

## 2019-03-24 DIAGNOSIS — C61 Malignant neoplasm of prostate: Secondary | ICD-10-CM | POA: Diagnosis not present

## 2019-03-25 ENCOUNTER — Other Ambulatory Visit: Payer: Self-pay

## 2019-03-25 ENCOUNTER — Ambulatory Visit
Admission: RE | Admit: 2019-03-25 | Discharge: 2019-03-25 | Disposition: A | Discharge status: Home / Self Care | Payer: Medicare Other | Source: Ambulatory Visit | Attending: Radiation Oncology | Admitting: Radiation Oncology

## 2019-03-25 DIAGNOSIS — Z510 Encounter for antineoplastic radiation therapy: Secondary | ICD-10-CM | POA: Diagnosis not present

## 2019-03-25 DIAGNOSIS — C61 Malignant neoplasm of prostate: Secondary | ICD-10-CM | POA: Diagnosis not present

## 2019-03-26 ENCOUNTER — Other Ambulatory Visit: Payer: Self-pay

## 2019-03-26 ENCOUNTER — Ambulatory Visit
Admission: RE | Admit: 2019-03-26 | Discharge: 2019-03-26 | Disposition: A | Discharge status: Home / Self Care | Payer: Medicare Other | Source: Ambulatory Visit | Attending: Radiation Oncology | Admitting: Radiation Oncology

## 2019-03-26 DIAGNOSIS — C61 Malignant neoplasm of prostate: Secondary | ICD-10-CM | POA: Diagnosis not present

## 2019-03-26 DIAGNOSIS — Z510 Encounter for antineoplastic radiation therapy: Secondary | ICD-10-CM | POA: Diagnosis not present

## 2019-03-27 ENCOUNTER — Ambulatory Visit
Admission: RE | Admit: 2019-03-27 | Discharge: 2019-03-27 | Disposition: A | Discharge status: Home / Self Care | Payer: Medicare Other | Source: Ambulatory Visit | Attending: Radiation Oncology | Admitting: Radiation Oncology

## 2019-03-27 ENCOUNTER — Other Ambulatory Visit: Payer: Self-pay

## 2019-03-27 DIAGNOSIS — C61 Malignant neoplasm of prostate: Secondary | ICD-10-CM | POA: Diagnosis not present

## 2019-03-27 DIAGNOSIS — Z510 Encounter for antineoplastic radiation therapy: Secondary | ICD-10-CM | POA: Diagnosis not present

## 2019-03-30 ENCOUNTER — Ambulatory Visit
Admission: RE | Admit: 2019-03-30 | Discharge: 2019-03-30 | Disposition: A | Discharge status: Home / Self Care | Payer: Medicare Other | Source: Ambulatory Visit | Attending: Radiation Oncology | Admitting: Radiation Oncology

## 2019-03-30 ENCOUNTER — Other Ambulatory Visit: Payer: Self-pay

## 2019-03-30 DIAGNOSIS — C61 Malignant neoplasm of prostate: Secondary | ICD-10-CM | POA: Diagnosis not present

## 2019-03-30 DIAGNOSIS — Z510 Encounter for antineoplastic radiation therapy: Secondary | ICD-10-CM | POA: Diagnosis not present

## 2019-03-31 ENCOUNTER — Ambulatory Visit
Admission: RE | Admit: 2019-03-31 | Discharge: 2019-03-31 | Disposition: A | Discharge status: Home / Self Care | Payer: Medicare Other | Source: Ambulatory Visit | Attending: Radiation Oncology | Admitting: Radiation Oncology

## 2019-03-31 ENCOUNTER — Other Ambulatory Visit: Payer: Self-pay

## 2019-03-31 DIAGNOSIS — Z510 Encounter for antineoplastic radiation therapy: Secondary | ICD-10-CM | POA: Diagnosis not present

## 2019-03-31 DIAGNOSIS — C61 Malignant neoplasm of prostate: Secondary | ICD-10-CM | POA: Diagnosis not present

## 2019-04-01 ENCOUNTER — Ambulatory Visit
Admission: RE | Admit: 2019-04-01 | Discharge: 2019-04-01 | Disposition: A | Discharge status: Home / Self Care | Payer: Medicare Other | Source: Ambulatory Visit | Attending: Radiation Oncology | Admitting: Radiation Oncology

## 2019-04-01 ENCOUNTER — Other Ambulatory Visit: Payer: Self-pay

## 2019-04-01 DIAGNOSIS — Z510 Encounter for antineoplastic radiation therapy: Secondary | ICD-10-CM | POA: Diagnosis not present

## 2019-04-01 DIAGNOSIS — C61 Malignant neoplasm of prostate: Secondary | ICD-10-CM | POA: Diagnosis not present

## 2019-04-02 ENCOUNTER — Ambulatory Visit
Admission: RE | Admit: 2019-04-02 | Discharge: 2019-04-02 | Disposition: A | Discharge status: Home / Self Care | Payer: Medicare Other | Source: Ambulatory Visit | Attending: Radiation Oncology | Admitting: Radiation Oncology

## 2019-04-02 ENCOUNTER — Other Ambulatory Visit: Payer: Self-pay

## 2019-04-02 ENCOUNTER — Telehealth: Payer: Self-pay | Admitting: Radiation Oncology

## 2019-04-02 DIAGNOSIS — Z510 Encounter for antineoplastic radiation therapy: Secondary | ICD-10-CM | POA: Diagnosis not present

## 2019-04-02 DIAGNOSIS — C61 Malignant neoplasm of prostate: Secondary | ICD-10-CM | POA: Diagnosis not present

## 2019-04-02 NOTE — Telephone Encounter (Signed)
Received voicemail message from patient requesting return call. Phoned patient back. Patient seen for PUT encounter on 10/16 and reported constipation x 3 days. Advised patient to begin Miralax every six hours until BM. Patient explains that on Sunday after taking Miralax as directed he had a large hard bowel movement. Patient reports seeing bright red blood on the tissue when he wipes since Sunday. Patient denies attempting any interventions. Patient denies filling the toilet bowl with bright red blood or passing any clots. Advised patient to apply internally and externally Preparation H, begin using baby wipes with aloe, and rotate pillow under each hip when sitting for prolonged periods. Educated patient that a hemorrhoid had most likely been irritated by his large bowel movement. Encouraged patient to continue Miralax in an attempt to regulate his bowels and increase his water intake. Patient verbalized understanding of all reviewed and expressed appreciation for the return call.

## 2019-04-03 ENCOUNTER — Other Ambulatory Visit: Payer: Self-pay

## 2019-04-03 ENCOUNTER — Encounter: Payer: Self-pay | Admitting: Medical Oncology

## 2019-04-03 ENCOUNTER — Ambulatory Visit
Admission: RE | Admit: 2019-04-03 | Discharge: 2019-04-03 | Disposition: A | Discharge status: Home / Self Care | Payer: Medicare Other | Source: Ambulatory Visit | Attending: Radiation Oncology | Admitting: Radiation Oncology

## 2019-04-03 DIAGNOSIS — C61 Malignant neoplasm of prostate: Secondary | ICD-10-CM | POA: Diagnosis not present

## 2019-04-03 DIAGNOSIS — Z510 Encounter for antineoplastic radiation therapy: Secondary | ICD-10-CM | POA: Diagnosis not present

## 2019-04-06 ENCOUNTER — Ambulatory Visit
Admission: RE | Admit: 2019-04-06 | Discharge: 2019-04-06 | Disposition: A | Discharge status: Home / Self Care | Payer: Medicare Other | Source: Ambulatory Visit | Attending: Radiation Oncology | Admitting: Radiation Oncology

## 2019-04-06 ENCOUNTER — Other Ambulatory Visit: Payer: Self-pay

## 2019-04-06 DIAGNOSIS — C61 Malignant neoplasm of prostate: Secondary | ICD-10-CM | POA: Diagnosis not present

## 2019-04-06 DIAGNOSIS — Z510 Encounter for antineoplastic radiation therapy: Secondary | ICD-10-CM | POA: Diagnosis not present

## 2019-04-07 ENCOUNTER — Ambulatory Visit
Admission: RE | Admit: 2019-04-07 | Discharge: 2019-04-07 | Disposition: A | Discharge status: Home / Self Care | Payer: Medicare Other | Source: Ambulatory Visit | Attending: Radiation Oncology | Admitting: Radiation Oncology

## 2019-04-07 ENCOUNTER — Other Ambulatory Visit: Payer: Self-pay

## 2019-04-07 DIAGNOSIS — Z510 Encounter for antineoplastic radiation therapy: Secondary | ICD-10-CM | POA: Diagnosis not present

## 2019-04-07 DIAGNOSIS — C61 Malignant neoplasm of prostate: Secondary | ICD-10-CM | POA: Diagnosis not present

## 2019-04-08 ENCOUNTER — Ambulatory Visit
Admission: RE | Admit: 2019-04-08 | Discharge: 2019-04-08 | Disposition: A | Discharge status: Home / Self Care | Payer: Medicare Other | Source: Ambulatory Visit | Attending: Radiation Oncology | Admitting: Radiation Oncology

## 2019-04-08 ENCOUNTER — Other Ambulatory Visit: Payer: Self-pay

## 2019-04-08 DIAGNOSIS — Z510 Encounter for antineoplastic radiation therapy: Secondary | ICD-10-CM | POA: Diagnosis not present

## 2019-04-08 DIAGNOSIS — C61 Malignant neoplasm of prostate: Secondary | ICD-10-CM | POA: Diagnosis not present

## 2019-04-09 ENCOUNTER — Ambulatory Visit
Admission: RE | Admit: 2019-04-09 | Discharge: 2019-04-09 | Disposition: A | Discharge status: Home / Self Care | Payer: Medicare Other | Source: Ambulatory Visit | Attending: Radiation Oncology | Admitting: Radiation Oncology

## 2019-04-09 ENCOUNTER — Other Ambulatory Visit: Payer: Self-pay

## 2019-04-09 DIAGNOSIS — Z510 Encounter for antineoplastic radiation therapy: Secondary | ICD-10-CM | POA: Diagnosis not present

## 2019-04-09 DIAGNOSIS — C61 Malignant neoplasm of prostate: Secondary | ICD-10-CM | POA: Diagnosis not present

## 2019-04-10 ENCOUNTER — Ambulatory Visit
Admission: RE | Admit: 2019-04-10 | Discharge: 2019-04-10 | Disposition: A | Discharge status: Home / Self Care | Payer: Medicare Other | Source: Ambulatory Visit | Attending: Radiation Oncology | Admitting: Radiation Oncology

## 2019-04-10 ENCOUNTER — Other Ambulatory Visit: Payer: Self-pay

## 2019-04-10 DIAGNOSIS — Z510 Encounter for antineoplastic radiation therapy: Secondary | ICD-10-CM | POA: Diagnosis not present

## 2019-04-10 DIAGNOSIS — C61 Malignant neoplasm of prostate: Secondary | ICD-10-CM | POA: Diagnosis not present

## 2019-04-13 ENCOUNTER — Other Ambulatory Visit: Payer: Self-pay

## 2019-04-13 ENCOUNTER — Ambulatory Visit
Admission: RE | Admit: 2019-04-13 | Discharge: 2019-04-13 | Disposition: A | Discharge status: Home / Self Care | Payer: Medicare Other | Source: Ambulatory Visit | Attending: Radiation Oncology | Admitting: Radiation Oncology

## 2019-04-13 DIAGNOSIS — C61 Malignant neoplasm of prostate: Secondary | ICD-10-CM | POA: Insufficient documentation

## 2019-04-13 DIAGNOSIS — Z510 Encounter for antineoplastic radiation therapy: Secondary | ICD-10-CM | POA: Insufficient documentation

## 2019-04-14 ENCOUNTER — Other Ambulatory Visit: Payer: Self-pay

## 2019-04-14 ENCOUNTER — Ambulatory Visit
Admission: RE | Admit: 2019-04-14 | Discharge: 2019-04-14 | Disposition: A | Discharge status: Home / Self Care | Payer: Medicare Other | Source: Ambulatory Visit | Attending: Radiation Oncology | Admitting: Radiation Oncology

## 2019-04-14 DIAGNOSIS — C61 Malignant neoplasm of prostate: Secondary | ICD-10-CM | POA: Diagnosis not present

## 2019-04-14 DIAGNOSIS — Z510 Encounter for antineoplastic radiation therapy: Secondary | ICD-10-CM | POA: Diagnosis not present

## 2019-04-15 ENCOUNTER — Other Ambulatory Visit: Payer: Self-pay

## 2019-04-15 ENCOUNTER — Ambulatory Visit
Admission: RE | Admit: 2019-04-15 | Discharge: 2019-04-15 | Disposition: A | Discharge status: Home / Self Care | Payer: Medicare Other | Source: Ambulatory Visit | Attending: Radiation Oncology | Admitting: Radiation Oncology

## 2019-04-15 DIAGNOSIS — Z510 Encounter for antineoplastic radiation therapy: Secondary | ICD-10-CM | POA: Diagnosis not present

## 2019-04-15 DIAGNOSIS — C61 Malignant neoplasm of prostate: Secondary | ICD-10-CM | POA: Diagnosis not present

## 2019-04-16 ENCOUNTER — Ambulatory Visit
Admission: RE | Admit: 2019-04-16 | Discharge: 2019-04-16 | Disposition: A | Discharge status: Home / Self Care | Payer: Medicare Other | Source: Ambulatory Visit | Attending: Radiation Oncology | Admitting: Radiation Oncology

## 2019-04-16 ENCOUNTER — Other Ambulatory Visit: Payer: Self-pay

## 2019-04-16 DIAGNOSIS — Z510 Encounter for antineoplastic radiation therapy: Secondary | ICD-10-CM | POA: Diagnosis not present

## 2019-04-16 DIAGNOSIS — C61 Malignant neoplasm of prostate: Secondary | ICD-10-CM | POA: Diagnosis not present

## 2019-04-17 ENCOUNTER — Ambulatory Visit
Admission: RE | Admit: 2019-04-17 | Discharge: 2019-04-17 | Disposition: A | Discharge status: Home / Self Care | Payer: Medicare Other | Source: Ambulatory Visit | Attending: Radiation Oncology | Admitting: Radiation Oncology

## 2019-04-17 DIAGNOSIS — C61 Malignant neoplasm of prostate: Secondary | ICD-10-CM | POA: Diagnosis not present

## 2019-04-17 DIAGNOSIS — Z510 Encounter for antineoplastic radiation therapy: Secondary | ICD-10-CM | POA: Diagnosis not present

## 2019-04-20 ENCOUNTER — Ambulatory Visit
Admission: RE | Admit: 2019-04-20 | Discharge: 2019-04-20 | Disposition: A | Discharge status: Home / Self Care | Payer: Medicare Other | Source: Ambulatory Visit | Attending: Radiation Oncology | Admitting: Radiation Oncology

## 2019-04-20 ENCOUNTER — Other Ambulatory Visit: Payer: Self-pay

## 2019-04-20 DIAGNOSIS — C61 Malignant neoplasm of prostate: Secondary | ICD-10-CM | POA: Diagnosis not present

## 2019-04-20 DIAGNOSIS — Z510 Encounter for antineoplastic radiation therapy: Secondary | ICD-10-CM | POA: Diagnosis not present

## 2019-04-21 ENCOUNTER — Other Ambulatory Visit: Payer: Self-pay

## 2019-04-21 ENCOUNTER — Ambulatory Visit
Admission: RE | Admit: 2019-04-21 | Discharge: 2019-04-21 | Disposition: A | Discharge status: Home / Self Care | Payer: Medicare Other | Source: Ambulatory Visit | Attending: Radiation Oncology | Admitting: Radiation Oncology

## 2019-04-21 DIAGNOSIS — Z510 Encounter for antineoplastic radiation therapy: Secondary | ICD-10-CM | POA: Diagnosis not present

## 2019-04-21 DIAGNOSIS — C61 Malignant neoplasm of prostate: Secondary | ICD-10-CM | POA: Diagnosis not present

## 2019-04-22 ENCOUNTER — Ambulatory Visit
Admission: RE | Admit: 2019-04-22 | Discharge: 2019-04-22 | Disposition: A | Discharge status: Home / Self Care | Payer: Medicare Other | Source: Ambulatory Visit | Attending: Radiation Oncology | Admitting: Radiation Oncology

## 2019-04-22 ENCOUNTER — Other Ambulatory Visit: Payer: Self-pay

## 2019-04-22 DIAGNOSIS — Z510 Encounter for antineoplastic radiation therapy: Secondary | ICD-10-CM | POA: Diagnosis not present

## 2019-04-22 DIAGNOSIS — C61 Malignant neoplasm of prostate: Secondary | ICD-10-CM | POA: Diagnosis not present

## 2019-04-23 ENCOUNTER — Ambulatory Visit
Admission: RE | Admit: 2019-04-23 | Discharge: 2019-04-23 | Disposition: A | Discharge status: Home / Self Care | Payer: Medicare Other | Source: Ambulatory Visit | Attending: Radiation Oncology | Admitting: Radiation Oncology

## 2019-04-23 ENCOUNTER — Encounter: Payer: Self-pay | Admitting: Radiation Oncology

## 2019-04-23 ENCOUNTER — Other Ambulatory Visit: Payer: Self-pay

## 2019-04-23 DIAGNOSIS — C61 Malignant neoplasm of prostate: Secondary | ICD-10-CM | POA: Diagnosis not present

## 2019-04-23 DIAGNOSIS — Z510 Encounter for antineoplastic radiation therapy: Secondary | ICD-10-CM | POA: Diagnosis not present

## 2019-04-29 DIAGNOSIS — C61 Malignant neoplasm of prostate: Secondary | ICD-10-CM | POA: Diagnosis not present

## 2019-05-28 ENCOUNTER — Encounter: Payer: Self-pay | Admitting: Urology

## 2019-05-28 ENCOUNTER — Ambulatory Visit
Admission: RE | Admit: 2019-05-28 | Discharge: 2019-05-28 | Disposition: A | Discharge status: Home / Self Care | Payer: Medicare Other | Source: Ambulatory Visit | Attending: Urology | Admitting: Urology

## 2019-05-28 ENCOUNTER — Other Ambulatory Visit: Payer: Self-pay

## 2019-05-28 DIAGNOSIS — C61 Malignant neoplasm of prostate: Secondary | ICD-10-CM

## 2019-05-28 NOTE — Progress Notes (Addendum)
Radiation Oncology         [phone] ________________________________  Name: Ian Park MRN: [id number]  Date: 05/28/2019  DOB: November 25, 1950  Post Treatment Note  CC: [name], MD  [name], MD  Diagnosis:   68 y.o.gentleman with a rising, detectable PSA of 0.119, s/p RALP forStage pT3a, pN0,Gleason 4+3, and PSA of6.57.  Interval Since Last Radiation:  5 weeks  03/03/19 - 04/23/19: The prostatic fossa was treated to 68.4 Gy in 38 fractions of 1.8 Gy each.  Narrative:  I spoke with the patient to conduct his routine scheduled 1 month follow up visit via telephone to spare the patient unnecessary potential exposure in the healthcare setting during the current COVID-19 pandemic.  The patient was notified in advance and gave permission to proceed with this visit format. He tolerated his radiation treatments relatively well with only mid urinary bother and modest fatigue.  He did experience occasional dysuria, increased frequency and incomplete emptying with mildly weakened stream but felt he emptied his bladder well for the most part.  He denied any abdominal pain or bowel issues.                              On review of systems, the patient states that he is doing very well overall.  He denies experiencing any significant LUTS during the course of treatment and feels that he is back to his baseline at this point.  He specifically denies dysuria, gross hematuria, excessive daytime frequency, straining to void, incomplete bladder emptying or incontinence.  He continues with occasional constipation and has continued to use MiraLAX as needed.  He denies abdominal pain, nausea, vomiting or diarrhea.  He has not had recent fevers, chills or night sweats.  He feels that his energy level is gradually returning as well.  Overall, he is quite pleased with his progress to date.  ALLERGIES:  has No Known Allergies.  Meds: Current Outpatient Medications  Medication Sig Dispense Refill  .  acetaminophen (TYLENOL) 500 MG tablet Take 1,000 mg by mouth every three (3) days as needed for moderate pain.    [name] ALPRAZolam (XANAX) 0.25 MG tablet Take 0.25 mg by mouth at bedtime as needed for sleep.     [name] atorvastatin (LIPITOR) 20 MG tablet Take 20 mg by mouth daily.      . Azelastine HCl 0.15 % SOLN 1SPRAY IN EACH NOSTRIL TWICE A DAY NASALLY 30 DAY(S)    . cetirizine (ZYRTEC) 10 MG tablet Take 10 mg by mouth daily.      . Cholecalciferol (VITAMIN D) 2000 units CAPS Take 2,000 Units by mouth 2 (two) times daily.    . diphenhydrAMINE (BENADRYL) 25 MG tablet Take 25 mg by mouth at bedtime.     . fluticasone (FLONASE) 50 MCG/ACT nasal spray Place 2 sprays into both nostrils daily.    . meloxicam (MOBIC) 15 MG tablet TAKE 1 TABLET BY MOUTH ONCE A DAY FOR TWO WEEKS, AND THEN ONLY AS NEEDED AFTERWARDS.    . Multiple Vitamin (MULTIVITAMIN) tablet Take 0.5 tablets by mouth 2 (two) times daily.    . tadalafil (CIALIS) 5 MG tablet Cialis 5 mg tablet  TAKE 1 TABLET BY MOUTH EVERY DAY    . traMADol (ULTRAM) 50 MG tablet Take 1-2 tablets (50-100 mg total) by mouth every 6 (six) hours as needed for moderate pain (not relieved by oxycodone). 40 tablet 0  . traZODone (DESYREL) 100  MG tablet TAKE 1 TABLET BY MOUTH AT BEDTIME WHEN NECESSARY FOR INSOMNIA    . vitamin B-12 (CYANOCOBALAMIN) 500 MCG tablet Take 500 mcg by mouth daily.    . vitamin C (ASCORBIC ACID) 250 MG tablet Take 500 mg by mouth daily.    . Zinc 50 MG TABS Take 50 mg by mouth daily.    [name] Zoster Vaccine Adjuvanted [facility]) injection Shingrix (PF) 50 mcg/0.5 mL intramuscular suspension, kit    . aspirin EC 325 MG EC tablet Take 1 tablet (325 mg total) by mouth 2 (two) times daily. Take for three weeks to prevent blood clots (Patient not taking: Reported on 05/28/2019) 40 tablet 0  . buPROPion (WELLBUTRIN XL) 150 MG 24 hr tablet TAKE 1 TABLET BY MOUTH EVERY DAY IN THE MORNING    . cyclobenzaprine (FLEXERIL) 10 MG tablet cyclobenzaprine 10 mg  tablet  TAKE 1 TABLET BY MOUTH THREE TIMES A DAY AS NEEDED    . desonide (DESOWEN) 0.05 % cream desonide 0.05 % topical cream  APPLY TO FACE UP TO TWICE A DAY AS NEEDED    . doxycycline (VIBRA-TABS) 100 MG tablet     . ibuprofen (ADVIL,MOTRIN) 200 MG tablet Take 400 mg by mouth 2 (two) times daily as needed.    . Melatonin 5 MG TABS Take 5 mg by mouth at bedtime.    . methocarbamol (ROBAXIN) 500 MG tablet Take 1 tablet (500 mg total) by mouth every 6 (six) hours as needed for muscle spasms. (Patient not taking: Reported on 05/28/2019) 40 tablet 0  . oxyCODONE (OXY IR/ROXICODONE) 5 MG immediate release tablet Take 1-2 tablets (5-10 mg total) by mouth every 6 (six) hours as needed for moderate pain (pain score 4-6). (Patient not taking: Reported on 05/28/2019) 56 tablet 0  . predniSONE (DELTASONE) 20 MG tablet TAKE 1 TABLET (20 MG TOTAL) BY MOUTH 3X DAILY WITH FOOD FOR 14 DAYS.     No current facility-administered medications for this encounter.    Physical Findings:  vitals were not taken for this visit.   /Unable to assess due to telephone follow-up visit format.  Lab Findings: Lab Results  Component Value Date   WBC 12.8 (H) 10/31/2017   HGB 12.5 (L) 10/31/2017   HCT 37.5 (L) 10/31/2017   MCV 93.1 10/31/2017   PLT 258 10/31/2017     Radiographic Findings: No results found.  Impression/Plan: 1. 68 y.o.gentleman with biochemical recurrence of prostate cancer as evidenced by a rising, detectable PSA of 0.119, s/p RALP forStage pT3a, pN0,Gleason 4+3, and PSA of6.57. He will continue to follow up with urology for ongoing PSA determinations and has an appointment scheduled with Dr. [name] on 08/12/19. He understands what to expect with regards to PSA monitoring going forward. I will look forward to following his response to treatment via correspondence with urology, and would be happy to continue to participate in his care if clinically indicated. I talked to the patient about what to  expect in the future, including his risk for erectile dysfunction and rectal bleeding. I encouraged him to call or return to the office if he has any questions regarding his previous radiation or possible radiation side effects. He was comfortable with this plan and will follow up as needed.  2.  Cancer screening:  Due the patient's history and his age, he should receive screening for skin cancers, prostate cancer, lung cancer, and colon cancers.  The information and recommendations are listed on the patient's comprehensive care plan/treatment summary and  will be mailed to the patient.   A copy of today's visit as well as the detailed survivorship care plan will also be shared with patient's PCP so that transfer of care, with specific responsibilities can be identified.   3.  Health maintenance and wellness promotion: He was encouraged to consume 5-7 servings of fruits and vegetables per day. A copy of the "Nutrition [employer]" handout, as well as the handout "Take Control of Your Health and Reduce Your Cancer Risk" from the [employer] will be mailed to the patient in his survivorship care plan packet.  He is encouraged to engage in moderate to vigorous exercise for 30 minutes per day most days of the week. There is also information on the [employer] program, which is designed for cancer survivors to help them become more physically fit after cancer treatments, included in his Care Plan packet. We discussed that a healthy BMI is 18.5-24.9 and that maintaining a healthy weight reduces risk of cancer recurrences.  He was instructed to limit his alcohol consumption and continue to abstain from tobacco use.  Lastly, he was encouraged to use sunscreen and wear protective clothing when in the sun.       Nicholos Johns, PA-C

## 2019-05-29 NOTE — Progress Notes (Signed)
  Radiation Oncology         [phone] ________________________________  Name: Ian Park MRN: [id number]  Date: 04/23/2019  DOB: 1950/10/19  End of Treatment Note  Diagnosis:   68 y.o.gentleman with a rising, detectable PSA of 0.119, s/p RALP forStage pT3a, pN0,Gleason 4+3, and PSA of6.57.     Indication for treatment:  Curative, Salvage Prostatic Fossa Radiotherapy       Radiation treatment dates:   03/03/2019 - 04/23/2019  Site/dose:   The prostatic fossa was treated to 68.4 Gy in 38 fractions of 1.8 Gy  Beams/energy:   The prostatic fossa was treated using VMAT intensity modulated radiotherapy delivering 6 megavolt photons. Image guidance was performed with CB-CT studies prior to each fraction. He was immobilized with a body fix lower extremity mold.  Narrative: The patient tolerated radiation treatment relatively well. During treatment, he reported nocturia x2-3, occasional dysuria, and constipation. Towards the end of treatment, he reported mild fatigue, and some incomplete bladder emptying. He also experienced rectal bleeding, which he attributed to hemorrhoids.   Plan: The patient has completed radiation treatment. He will return to radiation oncology clinic for routine followup in one month. I advised him to call or return sooner if he has any questions or concerns related to his recovery or treatment. ________________________________  Sheral Apley. Tammi Klippel, M.D.  This document serves as a record of services personally performed by Tyler Pita, MD. It was created on his behalf by Wilburn Mylar, a trained medical scribe. The creation of this record is based on the scribe's personal observations and the provider's statements to them. This document has been checked and approved by the attending provider.

## 2019-07-06 DIAGNOSIS — Z23 Encounter for immunization: Secondary | ICD-10-CM | POA: Diagnosis not present

## 2019-07-10 ENCOUNTER — Ambulatory Visit: Payer: No Typology Code available for payment source

## 2019-07-21 ENCOUNTER — Ambulatory Visit: Payer: No Typology Code available for payment source

## 2019-07-27 DIAGNOSIS — Z23 Encounter for immunization: Secondary | ICD-10-CM | POA: Diagnosis not present

## 2019-08-05 DIAGNOSIS — C61 Malignant neoplasm of prostate: Secondary | ICD-10-CM | POA: Diagnosis not present

## 2019-08-12 DIAGNOSIS — C61 Malignant neoplasm of prostate: Secondary | ICD-10-CM | POA: Diagnosis not present

## 2019-08-12 DIAGNOSIS — N5201 Erectile dysfunction due to arterial insufficiency: Secondary | ICD-10-CM | POA: Diagnosis not present

## 2019-10-09 DIAGNOSIS — M5136 Other intervertebral disc degeneration, lumbar region: Secondary | ICD-10-CM | POA: Diagnosis not present

## 2019-10-09 DIAGNOSIS — M419 Scoliosis, unspecified: Secondary | ICD-10-CM | POA: Diagnosis not present

## 2019-10-09 DIAGNOSIS — M545 Low back pain: Secondary | ICD-10-CM | POA: Diagnosis not present

## 2019-10-09 DIAGNOSIS — Z8546 Personal history of malignant neoplasm of prostate: Secondary | ICD-10-CM | POA: Diagnosis not present

## 2019-10-09 DIAGNOSIS — E782 Mixed hyperlipidemia: Secondary | ICD-10-CM | POA: Diagnosis not present

## 2019-10-09 DIAGNOSIS — M2170 Unequal limb length (acquired), unspecified site: Secondary | ICD-10-CM | POA: Diagnosis not present

## 2019-10-09 DIAGNOSIS — C61 Malignant neoplasm of prostate: Secondary | ICD-10-CM | POA: Diagnosis not present

## 2019-10-15 DIAGNOSIS — Z8546 Personal history of malignant neoplasm of prostate: Secondary | ICD-10-CM | POA: Diagnosis not present

## 2019-10-15 DIAGNOSIS — C61 Malignant neoplasm of prostate: Secondary | ICD-10-CM | POA: Diagnosis not present

## 2019-10-15 DIAGNOSIS — E782 Mixed hyperlipidemia: Secondary | ICD-10-CM | POA: Diagnosis not present

## 2019-10-21 DIAGNOSIS — M545 Low back pain: Secondary | ICD-10-CM | POA: Diagnosis not present

## 2019-10-26 DIAGNOSIS — M545 Low back pain: Secondary | ICD-10-CM | POA: Diagnosis not present

## 2019-11-02 DIAGNOSIS — M545 Low back pain: Secondary | ICD-10-CM | POA: Diagnosis not present

## 2019-11-05 DIAGNOSIS — M545 Low back pain: Secondary | ICD-10-CM | POA: Diagnosis not present

## 2019-11-16 DIAGNOSIS — M545 Low back pain: Secondary | ICD-10-CM | POA: Diagnosis not present

## 2019-11-23 DIAGNOSIS — M545 Low back pain: Secondary | ICD-10-CM | POA: Diagnosis not present

## 2019-11-25 DIAGNOSIS — Z8579 Personal history of other malignant neoplasms of lymphoid, hematopoietic and related tissues: Secondary | ICD-10-CM | POA: Diagnosis not present

## 2019-11-25 DIAGNOSIS — Z0000 Encounter for general adult medical examination without abnormal findings: Secondary | ICD-10-CM | POA: Diagnosis not present

## 2019-11-25 DIAGNOSIS — Z86010 Personal history of colonic polyps: Secondary | ICD-10-CM | POA: Diagnosis not present

## 2019-11-25 DIAGNOSIS — Z8546 Personal history of malignant neoplasm of prostate: Secondary | ICD-10-CM | POA: Diagnosis not present

## 2019-11-25 DIAGNOSIS — Z87442 Personal history of urinary calculi: Secondary | ICD-10-CM | POA: Diagnosis not present

## 2019-11-25 DIAGNOSIS — G4700 Insomnia, unspecified: Secondary | ICD-10-CM | POA: Diagnosis not present

## 2019-11-25 DIAGNOSIS — R7301 Impaired fasting glucose: Secondary | ICD-10-CM | POA: Diagnosis not present

## 2019-11-25 DIAGNOSIS — E782 Mixed hyperlipidemia: Secondary | ICD-10-CM | POA: Diagnosis not present

## 2019-11-26 DIAGNOSIS — M545 Low back pain: Secondary | ICD-10-CM | POA: Diagnosis not present

## 2019-11-30 DIAGNOSIS — E782 Mixed hyperlipidemia: Secondary | ICD-10-CM | POA: Diagnosis not present

## 2019-11-30 DIAGNOSIS — R899 Unspecified abnormal finding in specimens from other organs, systems and tissues: Secondary | ICD-10-CM | POA: Diagnosis not present

## 2019-11-30 DIAGNOSIS — Z86010 Personal history of colonic polyps: Secondary | ICD-10-CM | POA: Diagnosis not present

## 2019-11-30 DIAGNOSIS — Z8579 Personal history of other malignant neoplasms of lymphoid, hematopoietic and related tissues: Secondary | ICD-10-CM | POA: Diagnosis not present

## 2019-11-30 DIAGNOSIS — Z87442 Personal history of urinary calculi: Secondary | ICD-10-CM | POA: Diagnosis not present

## 2019-11-30 DIAGNOSIS — R7301 Impaired fasting glucose: Secondary | ICD-10-CM | POA: Diagnosis not present

## 2019-11-30 DIAGNOSIS — Z8546 Personal history of malignant neoplasm of prostate: Secondary | ICD-10-CM | POA: Diagnosis not present

## 2019-11-30 DIAGNOSIS — G4700 Insomnia, unspecified: Secondary | ICD-10-CM | POA: Diagnosis not present

## 2020-01-07 DIAGNOSIS — E782 Mixed hyperlipidemia: Secondary | ICD-10-CM | POA: Diagnosis not present

## 2020-01-07 DIAGNOSIS — Z8546 Personal history of malignant neoplasm of prostate: Secondary | ICD-10-CM | POA: Diagnosis not present

## 2020-01-07 DIAGNOSIS — C61 Malignant neoplasm of prostate: Secondary | ICD-10-CM | POA: Diagnosis not present

## 2020-02-08 DIAGNOSIS — N5201 Erectile dysfunction due to arterial insufficiency: Secondary | ICD-10-CM | POA: Diagnosis not present

## 2020-02-08 DIAGNOSIS — C61 Malignant neoplasm of prostate: Secondary | ICD-10-CM | POA: Diagnosis not present

## 2020-02-08 DIAGNOSIS — N393 Stress incontinence (female) (male): Secondary | ICD-10-CM | POA: Diagnosis not present

## 2020-02-22 DIAGNOSIS — Z23 Encounter for immunization: Secondary | ICD-10-CM | POA: Diagnosis not present

## 2020-03-02 DIAGNOSIS — Z791 Long term (current) use of non-steroidal anti-inflammatories (NSAID): Secondary | ICD-10-CM | POA: Diagnosis not present

## 2020-03-02 DIAGNOSIS — M19011 Primary osteoarthritis, right shoulder: Secondary | ICD-10-CM | POA: Diagnosis not present

## 2020-03-09 DIAGNOSIS — Z8546 Personal history of malignant neoplasm of prostate: Secondary | ICD-10-CM | POA: Diagnosis not present

## 2020-03-09 DIAGNOSIS — E782 Mixed hyperlipidemia: Secondary | ICD-10-CM | POA: Diagnosis not present

## 2020-03-09 DIAGNOSIS — C61 Malignant neoplasm of prostate: Secondary | ICD-10-CM | POA: Diagnosis not present

## 2020-03-18 DIAGNOSIS — Z23 Encounter for immunization: Secondary | ICD-10-CM | POA: Diagnosis not present

## 2020-04-19 DIAGNOSIS — Z8546 Personal history of malignant neoplasm of prostate: Secondary | ICD-10-CM | POA: Diagnosis not present

## 2020-04-19 DIAGNOSIS — G4700 Insomnia, unspecified: Secondary | ICD-10-CM | POA: Diagnosis not present

## 2020-04-19 DIAGNOSIS — C61 Malignant neoplasm of prostate: Secondary | ICD-10-CM | POA: Diagnosis not present

## 2020-04-19 DIAGNOSIS — E782 Mixed hyperlipidemia: Secondary | ICD-10-CM | POA: Diagnosis not present

## 2020-06-30 DIAGNOSIS — Z03818 Encounter for observation for suspected exposure to other biological agents ruled out: Secondary | ICD-10-CM | POA: Diagnosis not present

## 2020-06-30 DIAGNOSIS — Z20822 Contact with and (suspected) exposure to covid-19: Secondary | ICD-10-CM | POA: Diagnosis not present

## 2020-07-15 DIAGNOSIS — Z791 Long term (current) use of non-steroidal anti-inflammatories (NSAID): Secondary | ICD-10-CM | POA: Diagnosis not present

## 2020-07-15 DIAGNOSIS — M19011 Primary osteoarthritis, right shoulder: Secondary | ICD-10-CM | POA: Diagnosis not present

## 2020-08-19 DIAGNOSIS — C61 Malignant neoplasm of prostate: Secondary | ICD-10-CM | POA: Diagnosis not present

## 2020-08-24 DIAGNOSIS — G4700 Insomnia, unspecified: Secondary | ICD-10-CM | POA: Diagnosis not present

## 2020-08-24 DIAGNOSIS — E782 Mixed hyperlipidemia: Secondary | ICD-10-CM | POA: Diagnosis not present

## 2020-08-24 DIAGNOSIS — Z8546 Personal history of malignant neoplasm of prostate: Secondary | ICD-10-CM | POA: Diagnosis not present

## 2020-08-26 DIAGNOSIS — C61 Malignant neoplasm of prostate: Secondary | ICD-10-CM | POA: Diagnosis not present

## 2020-08-26 DIAGNOSIS — N5201 Erectile dysfunction due to arterial insufficiency: Secondary | ICD-10-CM | POA: Diagnosis not present

## 2020-11-25 DIAGNOSIS — R7301 Impaired fasting glucose: Secondary | ICD-10-CM | POA: Diagnosis not present

## 2020-11-25 DIAGNOSIS — E782 Mixed hyperlipidemia: Secondary | ICD-10-CM | POA: Diagnosis not present

## 2020-11-25 DIAGNOSIS — Z8546 Personal history of malignant neoplasm of prostate: Secondary | ICD-10-CM | POA: Diagnosis not present

## 2020-11-25 DIAGNOSIS — R7989 Other specified abnormal findings of blood chemistry: Secondary | ICD-10-CM | POA: Diagnosis not present

## 2020-11-25 DIAGNOSIS — R946 Abnormal results of thyroid function studies: Secondary | ICD-10-CM | POA: Diagnosis not present

## 2020-11-25 DIAGNOSIS — Z125 Encounter for screening for malignant neoplasm of prostate: Secondary | ICD-10-CM | POA: Diagnosis not present

## 2020-11-25 DIAGNOSIS — Z8579 Personal history of other malignant neoplasms of lymphoid, hematopoietic and related tissues: Secondary | ICD-10-CM | POA: Diagnosis not present

## 2020-12-05 DIAGNOSIS — Z1389 Encounter for screening for other disorder: Secondary | ICD-10-CM | POA: Diagnosis not present

## 2020-12-05 DIAGNOSIS — Z0000 Encounter for general adult medical examination without abnormal findings: Secondary | ICD-10-CM | POA: Diagnosis not present

## 2020-12-06 DIAGNOSIS — Z6835 Body mass index (BMI) 35.0-35.9, adult: Secondary | ICD-10-CM | POA: Diagnosis not present

## 2020-12-06 DIAGNOSIS — N62 Hypertrophy of breast: Secondary | ICD-10-CM | POA: Diagnosis not present

## 2020-12-06 DIAGNOSIS — R7301 Impaired fasting glucose: Secondary | ICD-10-CM | POA: Diagnosis not present

## 2020-12-06 DIAGNOSIS — Z8546 Personal history of malignant neoplasm of prostate: Secondary | ICD-10-CM | POA: Diagnosis not present

## 2020-12-06 DIAGNOSIS — C8511 Unspecified B-cell lymphoma, lymph nodes of head, face, and neck: Secondary | ICD-10-CM | POA: Diagnosis not present

## 2020-12-06 DIAGNOSIS — R946 Abnormal results of thyroid function studies: Secondary | ICD-10-CM | POA: Diagnosis not present

## 2020-12-06 DIAGNOSIS — N6342 Unspecified lump in left breast, subareolar: Secondary | ICD-10-CM | POA: Diagnosis not present

## 2020-12-06 DIAGNOSIS — E782 Mixed hyperlipidemia: Secondary | ICD-10-CM | POA: Diagnosis not present

## 2020-12-07 ENCOUNTER — Other Ambulatory Visit: Payer: Self-pay | Admitting: Family Medicine

## 2020-12-07 DIAGNOSIS — N6342 Unspecified lump in left breast, subareolar: Secondary | ICD-10-CM

## 2021-02-08 ENCOUNTER — Ambulatory Visit: Payer: Medicare Other

## 2021-02-08 ENCOUNTER — Ambulatory Visit
Admission: RE | Admit: 2021-02-08 | Discharge: 2021-02-08 | Disposition: A | Discharge status: Home / Self Care | Payer: Medicare Other | Source: Ambulatory Visit | Attending: Family Medicine | Admitting: Family Medicine

## 2021-02-08 ENCOUNTER — Other Ambulatory Visit: Payer: Self-pay

## 2021-02-08 DIAGNOSIS — N62 Hypertrophy of breast: Secondary | ICD-10-CM | POA: Diagnosis not present

## 2021-02-08 DIAGNOSIS — N6342 Unspecified lump in left breast, subareolar: Secondary | ICD-10-CM

## 2021-02-08 DIAGNOSIS — R922 Inconclusive mammogram: Secondary | ICD-10-CM | POA: Diagnosis not present

## 2021-03-08 DIAGNOSIS — G4700 Insomnia, unspecified: Secondary | ICD-10-CM | POA: Diagnosis not present

## 2021-03-08 DIAGNOSIS — Z8546 Personal history of malignant neoplasm of prostate: Secondary | ICD-10-CM | POA: Diagnosis not present

## 2021-03-08 DIAGNOSIS — C61 Malignant neoplasm of prostate: Secondary | ICD-10-CM | POA: Diagnosis not present

## 2021-03-08 DIAGNOSIS — E782 Mixed hyperlipidemia: Secondary | ICD-10-CM | POA: Diagnosis not present

## 2021-03-13 DIAGNOSIS — Z23 Encounter for immunization: Secondary | ICD-10-CM | POA: Diagnosis not present

## 2021-03-29 DIAGNOSIS — C61 Malignant neoplasm of prostate: Secondary | ICD-10-CM | POA: Diagnosis not present

## 2021-04-05 DIAGNOSIS — C61 Malignant neoplasm of prostate: Secondary | ICD-10-CM | POA: Diagnosis not present

## 2021-04-05 DIAGNOSIS — R310 Gross hematuria: Secondary | ICD-10-CM | POA: Diagnosis not present

## 2021-04-10 DIAGNOSIS — N62 Hypertrophy of breast: Secondary | ICD-10-CM | POA: Diagnosis not present

## 2021-04-10 DIAGNOSIS — R7303 Prediabetes: Secondary | ICD-10-CM | POA: Diagnosis not present

## 2021-04-10 DIAGNOSIS — H6122 Impacted cerumen, left ear: Secondary | ICD-10-CM | POA: Diagnosis not present

## 2021-04-10 DIAGNOSIS — R946 Abnormal results of thyroid function studies: Secondary | ICD-10-CM | POA: Diagnosis not present

## 2021-04-10 DIAGNOSIS — R7989 Other specified abnormal findings of blood chemistry: Secondary | ICD-10-CM | POA: Diagnosis not present

## 2021-04-10 DIAGNOSIS — Z6837 Body mass index (BMI) 37.0-37.9, adult: Secondary | ICD-10-CM | POA: Diagnosis not present

## 2021-04-10 DIAGNOSIS — C8511 Unspecified B-cell lymphoma, lymph nodes of head, face, and neck: Secondary | ICD-10-CM | POA: Diagnosis not present

## 2021-04-10 DIAGNOSIS — Z8546 Personal history of malignant neoplasm of prostate: Secondary | ICD-10-CM | POA: Diagnosis not present

## 2021-04-10 DIAGNOSIS — I872 Venous insufficiency (chronic) (peripheral): Secondary | ICD-10-CM | POA: Diagnosis not present

## 2021-04-10 DIAGNOSIS — E782 Mixed hyperlipidemia: Secondary | ICD-10-CM | POA: Diagnosis not present

## 2021-04-10 DIAGNOSIS — H9312 Tinnitus, left ear: Secondary | ICD-10-CM | POA: Diagnosis not present

## 2021-04-12 DIAGNOSIS — R310 Gross hematuria: Secondary | ICD-10-CM | POA: Diagnosis not present

## 2021-04-12 DIAGNOSIS — Z9079 Acquired absence of other genital organ(s): Secondary | ICD-10-CM | POA: Diagnosis not present

## 2021-04-12 DIAGNOSIS — N3289 Other specified disorders of bladder: Secondary | ICD-10-CM | POA: Diagnosis not present

## 2021-04-12 DIAGNOSIS — I700 Atherosclerosis of aorta: Secondary | ICD-10-CM | POA: Diagnosis not present

## 2021-12-07 ENCOUNTER — Other Ambulatory Visit: Payer: Self-pay | Admitting: Orthopedic Surgery

## 2021-12-07 DIAGNOSIS — M19011 Primary osteoarthritis, right shoulder: Secondary | ICD-10-CM

## 2022-01-08 ENCOUNTER — Ambulatory Visit
Admission: RE | Admit: 2022-01-08 | Discharge: 2022-01-08 | Disposition: A | Discharge status: Home / Self Care | Payer: Medicare Other | Source: Ambulatory Visit | Attending: Orthopedic Surgery | Admitting: Orthopedic Surgery

## 2022-01-08 DIAGNOSIS — M19011 Primary osteoarthritis, right shoulder: Secondary | ICD-10-CM

## 2022-05-08 NOTE — Patient Instructions (Signed)
DUE TO COVID-19 ONLY TWO VISITORS  (aged 69 and older)  ARE ALLOWED TO COME WITH YOU AND STAY IN THE WAITING ROOM ONLY DURING PRE OP AND PROCEDURE.   **NO VISITORS ARE ALLOWED IN THE SHORT STAY AREA OR RECOVERY ROOM!!**  IF YOU WILL BE ADMITTED INTO THE HOSPITAL YOU ARE ALLOWED ONLY FOUR SUPPORT PEOPLE DURING VISITATION HOURS ONLY (7 AM -8PM)   The support person(s) must pass our screening, gel in and out, and wear a mask at all times, including in the patient's room. Patients must also wear a mask when staff or their support person are in the room. Visitors GUEST BADGE MUST BE WORN VISIBLY  One adult visitor may remain with you overnight and MUST be in the room by 8 P.M.     Your procedure is scheduled on: 05/18/22   Report to [facility] Main Entrance    Report to admitting at : 5;15 AM   Call this number if you have problems the morning of surgery [phone]   Do not eat food :After Midnight.   After Midnight you may have the following liquids until : 4:30 AM DAY OF SURGERY  Water Black Coffee (sugar ok, NO MILK/CREAM OR CREAMERS)  Tea (sugar ok, NO MILK/CREAM OR CREAMERS) regular and decaf                             Plain Jell-O (NO RED)                                           Fruit ices (not with fruit pulp, NO RED)                                     Popsicles (NO RED)                                                                  Juice: apple, WHITE grape, WHITE cranberry Sports drinks like Gatorade (NO RED)   The day of surgery:  Drink ONE (1) Pre-Surgery Clear Ensure or G2 at: 4:30 AM the morning of surgery. Drink in one sitting. Do not sip.  This drink was given to you during your hospital  pre-op appointment visit. Nothing else to drink after completing the  Pre-Surgery Clear Ensure or G2.          If you have questions, please contact your surgeon's office   Oral Hygiene is also important to reduce your risk of infection.                                     Remember - BRUSH YOUR TEETH THE MORNING OF SURGERY WITH YOUR REGULAR TOOTHPASTE   Do NOT smoke after Midnight   Take these medicines the morning of surgery with A SIP OF WATER:cetirizine.use Flonase as usual.Tylenol as needed.  You may not have any metal on your body including hair pins, jewelry, and body piercing             Do not wear  lotions, powders, perfumes/cologne, or deodorant              Men may shave face and neck.   Do not bring valuables to the hospital. [place].   Contacts, dentures or bridgework may not be worn into surgery.   Bring small overnight bag day of surgery.   DO NOT [place]. PHARMACY WILL DISPENSE MEDICATIONS LISTED ON YOUR MEDICATION LIST TO YOU DURING YOUR ADMISSION [place]!    Patients discharged on the day of surgery will not be allowed to drive home.  Someone NEEDS to stay with you for the first 24 hours after anesthesia.   Special Instructions: Bring a copy of your healthcare power of attorney and living will documents         the day of surgery if you haven't scanned them before.              Please read over the following fact sheets you were given: IF YOU HAVE QUESTIONS ABOUT YOUR PRE-OP INSTRUCTIONS PLEASE CALL [phone]    [facility] - Preparing for Surgery Before surgery, you can play an important role.  Because skin is not sterile, your skin needs to be as free of germs as possible.  You can reduce the number of germs on your skin by washing with CHG (chlorahexidine gluconate) soap before surgery.  CHG is an antiseptic cleaner which kills germs and bonds with the skin to continue killing germs even after washing. Please DO NOT use if you have an allergy to CHG or antibacterial soaps.  If your skin becomes reddened/irritated stop using the CHG and inform your nurse when you arrive at Short Stay. Do not shave (including legs  and underarms) for at least 48 hours prior to the first CHG shower.  You may shave your face/neck. Please follow these instructions carefully:  1.  Shower with CHG Soap the night before surgery and the  morning of Surgery.  2.  If you choose to wash your hair, wash your hair first as usual with your  normal  shampoo.  3.  After you shampoo, rinse your hair and body thoroughly to remove the  shampoo.                           4.  Use CHG as you would any other liquid soap.  You can apply chg directly  to the skin and wash                       Gently with a scrungie or clean washcloth.  5.  Apply the CHG Soap to your body ONLY FROM THE NECK DOWN.   Do not use on face/ open                           Wound or open sores. Avoid contact with eyes, ears mouth and genitals (private parts).                       Wash face,  Genitals (private parts) with  your normal soap.             6.  Wash thoroughly, paying special attention to the area where your surgery  will be performed.  7.  Thoroughly rinse your body with warm water from the neck down.  8.  DO NOT shower/wash with your normal soap after using and rinsing off  the CHG Soap.                9.  Pat yourself dry with a clean towel.            10.  Wear clean pajamas.            11.  Place clean sheets on your bed the night of your first shower and do not  sleep with pets. Day of Surgery : Do not apply any lotions/deodorants the morning of surgery.  Please wear clean clothes to the hospital/surgery center.  FAILURE TO FOLLOW THESE INSTRUCTIONS MAY RESULT IN THE CANCELLATION OF YOUR SURGERY PATIENT SIGNATURE_________________________________  NURSE SIGNATURE__________________________________  ________________________________________________________________________[facility]- Preparing for Total Shoulder Arthroplasty    Before surgery, you can play an important role. Because skin is not sterile, your skin needs to be as free of germs as possible.  You can reduce the number of germs on your skin by using the following products. Benzoyl Peroxide Gel Reduces the number of germs present on the skin Applied twice a day to shoulder area starting two days before surgery    ==================================================================  Please follow these instructions carefully:  BENZOYL PEROXIDE 5% GEL  Please do not use if you have an allergy to benzoyl peroxide.   If your skin becomes reddened/irritated stop using the benzoyl peroxide.  Starting two days before surgery, apply as follows: Apply benzoyl peroxide in the morning and at night. Apply after taking a shower. If you are not taking a shower clean entire shoulder front, back, and side along with the armpit with a clean wet washcloth.  Place a quarter-sized dollop on your shoulder and rub in thoroughly, making sure to cover the front, back, and side of your shoulder, along with the armpit.   2 days before ____ AM   ____ PM              1 day before ____ AM   ____ PM                         Do this twice a day for two days.  (Last application is the night before surgery, AFTER using the CHG soap as described below).  Do NOT apply benzoyl peroxide gel on the day of surgery.  Incentive Spirometer  An incentive spirometer is a tool that can help keep your lungs clear and active. This tool measures how well you are filling your lungs with each breath. Taking long deep breaths may help reverse or decrease the chance of developing breathing (pulmonary) problems (especially infection) following: A long period of time when you are unable to move or be active. BEFORE THE PROCEDURE  If the spirometer includes an indicator to show your best effort, your nurse or respiratory therapist will set it to a desired goal. If possible, sit up straight or lean slightly forward. Try not to slouch. Hold the incentive spirometer in an upright position. INSTRUCTIONS FOR USE  Sit on the edge of  your bed if possible, or sit up as far as you can in bed or on a  chair. Hold the incentive spirometer in an upright position. Breathe out normally. Place the mouthpiece in your mouth and seal your lips tightly around it. Breathe in slowly and as deeply as possible, raising the piston or the ball toward the top of the column. Hold your breath for 3-5 seconds or for as long as possible. Allow the piston or ball to fall to the bottom of the column. Remove the mouthpiece from your mouth and breathe out normally. Rest for a few seconds and repeat Steps 1 through 7 at least 10 times every 1-2 hours when you are awake. Take your time and take a few normal breaths between deep breaths. The spirometer may include an indicator to show your best effort. Use the indicator as a goal to work toward during each repetition. After each set of 10 deep breaths, practice coughing to be sure your lungs are clear. If you have an incision (the cut made at the time of surgery), support your incision when coughing by placing a pillow or rolled up towels firmly against it. Once you are able to get out of bed, walk around indoors and cough well. You may stop using the incentive spirometer when instructed by your caregiver.  RISKS AND COMPLICATIONS Take your time so you do not get dizzy or light-headed. If you are in pain, you may need to take or ask for pain medication before doing incentive spirometry. It is harder to take a deep breath if you are having pain. AFTER USE Rest and breathe slowly and easily. It can be helpful to keep track of a log of your progress. Your caregiver can provide you with a simple table to help with this. If you are using the spirometer at home, follow these instructions: [place] IF:  You are having difficultly using the spirometer. You have trouble using the spirometer as often as instructed. Your pain medication is not giving enough relief while using the spirometer. You develop  fever of 100.5 F (38.1 C) or higher. SEEK IMMEDIATE MEDICAL CARE IF:  You cough up bloody sputum that had not been present before. You develop fever of 102 F (38.9 C) or greater. You develop worsening pain at or near the incision site. MAKE SURE YOU:  Understand these instructions. Will watch your condition. Will get help right away if you are not doing well or get worse. Document Released: 10/08/2006 Document Revised: 08/20/2011 Document Reviewed: 12/09/2006 [facility] Patient Information 2014 [place], [place].   ________________________________________________________________________

## 2022-05-09 ENCOUNTER — Other Ambulatory Visit: Payer: Self-pay

## 2022-05-09 ENCOUNTER — Encounter (HOSPITAL_COMMUNITY)
Admission: RE | Admit: 2022-05-09 | Discharge: 2022-05-09 | Disposition: A | Discharge status: Home / Self Care | Payer: Medicare Other | Source: Ambulatory Visit | Attending: Orthopedic Surgery | Admitting: Orthopedic Surgery

## 2022-05-09 ENCOUNTER — Encounter (HOSPITAL_COMMUNITY): Payer: Self-pay

## 2022-05-09 VITALS — BP 139/79 | HR 92 | Temp 98.6°F | Ht 66.5 in | Wt 215.0 lb

## 2022-05-09 DIAGNOSIS — Z01812 Encounter for preprocedural laboratory examination: Secondary | ICD-10-CM | POA: Insufficient documentation

## 2022-05-09 DIAGNOSIS — Z01818 Encounter for other preprocedural examination: Secondary | ICD-10-CM

## 2022-05-09 DIAGNOSIS — R7303 Prediabetes: Secondary | ICD-10-CM | POA: Diagnosis not present

## 2022-05-09 HISTORY — DX: Prediabetes: R73.03

## 2022-05-09 LAB — CBC
HCT: 41.1 % (ref 39.0–52.0)
Hemoglobin: 13.5 g/dL (ref 13.0–17.0)
MCH: 31 pg (ref 26.0–34.0)
MCHC: 32.8 g/dL (ref 30.0–36.0)
MCV: 94.3 fL (ref 80.0–100.0)
Platelets: 297 10*3/uL (ref 150–400)
RBC: 4.36 MIL/uL (ref 4.22–5.81)
RDW: 12.8 % (ref 11.5–15.5)
WBC: 5.4 10*3/uL (ref 4.0–10.5)
nRBC: 0 % (ref 0.0–0.2)

## 2022-05-09 LAB — SURGICAL PCR SCREEN
MRSA, PCR: NEGATIVE
Staphylococcus aureus: NEGATIVE

## 2022-05-09 LAB — GLUCOSE, CAPILLARY: Glucose-Capillary: 109 mg/dL — ABNORMAL HIGH (ref 70–99)

## 2022-05-09 NOTE — Progress Notes (Signed)
For Short Stay: [place] appointment date:  Bowel Prep reminder:   For Anesthesia: PCP - NO PCP Cardiologist -   Chest x-ray -  EKG -  Stress Test -  ECHO -  Cardiac Cath -  Pacemaker/ICD device last checked: Pacemaker orders received: Device Rep notified:  Spinal Cord Stimulator:  Sleep Study -  CPAP -   Fasting Blood Sugar -  Checks Blood Sugar _____ times a day Date and result of last Hgb A1c-  Last dose of GLP1 agonist-  05/07/22 GLP1 instructions: To hold the next dose.  Last dose of SGLT-2 inhibitors-  SGLT-2 instructions:   Blood Thinner Instructions: Aspirin Instructions: Last Dose:  Activity level: Can go up a flight of stairs and activities of daily living without stopping and without chest pain and/or shortness of breath   Able to exercise without chest pain and/or shortness of breath   Unable to go up a flight of stairs without chest pain and/or shortness of breath     Anesthesia review: Hx" Pre-DIA.  Patient denies shortness of breath, fever, cough and chest pain at PAT appointment   Patient verbalized understanding of instructions that were given to them at the PAT appointment. Patient was also instructed that they will need to review over the PAT instructions again at home before surgery.

## 2022-05-10 LAB — HEMOGLOBIN A1C
Hgb A1c MFr Bld: 5.5 % (ref 4.8–5.6)
Mean Plasma Glucose: 111 mg/dL

## 2022-05-18 ENCOUNTER — Ambulatory Visit (HOSPITAL_BASED_OUTPATIENT_CLINIC_OR_DEPARTMENT_OTHER): Payer: Medicare Other | Admitting: Anesthesiology

## 2022-05-18 ENCOUNTER — Ambulatory Visit (HOSPITAL_COMMUNITY)
Admission: RE | Admit: 2022-05-18 | Discharge: 2022-05-18 | Disposition: A | Discharge status: Home / Self Care | Payer: Medicare Other | Attending: Orthopedic Surgery | Admitting: Orthopedic Surgery

## 2022-05-18 ENCOUNTER — Encounter (HOSPITAL_COMMUNITY)
Admission: RE | Disposition: A | Discharge status: Home / Self Care | Payer: Self-pay | Source: Home / Self Care | Attending: Orthopedic Surgery

## 2022-05-18 ENCOUNTER — Ambulatory Visit (HOSPITAL_COMMUNITY): Payer: Medicare Other

## 2022-05-18 ENCOUNTER — Other Ambulatory Visit: Payer: Self-pay

## 2022-05-18 ENCOUNTER — Other Ambulatory Visit (HOSPITAL_COMMUNITY): Payer: No Typology Code available for payment source

## 2022-05-18 ENCOUNTER — Ambulatory Visit (HOSPITAL_COMMUNITY): Payer: Medicare Other | Admitting: Anesthesiology

## 2022-05-18 ENCOUNTER — Encounter (HOSPITAL_COMMUNITY): Payer: Self-pay | Admitting: Orthopedic Surgery

## 2022-05-18 DIAGNOSIS — Z23 Encounter for immunization: Secondary | ICD-10-CM | POA: Insufficient documentation

## 2022-05-18 DIAGNOSIS — Z8572 Personal history of non-Hodgkin lymphomas: Secondary | ICD-10-CM | POA: Insufficient documentation

## 2022-05-18 DIAGNOSIS — Z87442 Personal history of urinary calculi: Secondary | ICD-10-CM | POA: Diagnosis not present

## 2022-05-18 DIAGNOSIS — Z87891 Personal history of nicotine dependence: Secondary | ICD-10-CM | POA: Diagnosis not present

## 2022-05-18 DIAGNOSIS — R7303 Prediabetes: Secondary | ICD-10-CM | POA: Insufficient documentation

## 2022-05-18 DIAGNOSIS — M19011 Primary osteoarthritis, right shoulder: Secondary | ICD-10-CM | POA: Insufficient documentation

## 2022-05-18 DIAGNOSIS — E7800 Pure hypercholesterolemia, unspecified: Secondary | ICD-10-CM | POA: Diagnosis not present

## 2022-05-18 DIAGNOSIS — R519 Headache, unspecified: Secondary | ICD-10-CM | POA: Diagnosis not present

## 2022-05-18 HISTORY — PX: REVERSE SHOULDER ARTHROPLASTY: SHX5054

## 2022-05-18 SURGERY — ARTHROPLASTY, SHOULDER, TOTAL, REVERSE
Anesthesia: General | Site: Shoulder | Laterality: Right

## 2022-05-18 MED ORDER — SUGAMMADEX SODIUM 200 MG/2ML IV SOLN
INTRAVENOUS | Status: DC | PRN
  Administered 2022-05-18: 400 mg via INTRAVENOUS

## 2022-05-18 MED ORDER — ROCURONIUM BROMIDE 10 MG/ML (PF) SYRINGE
PREFILLED_SYRINGE | INTRAVENOUS | Status: AC
  Filled 2022-05-18: qty 30

## 2022-05-18 MED ORDER — DEXAMETHASONE SODIUM PHOSPHATE 10 MG/ML IJ SOLN
INTRAMUSCULAR | Status: DC | PRN
  Administered 2022-05-18: 8 mg via INTRAVENOUS

## 2022-05-18 MED ORDER — EPHEDRINE 5 MG/ML INJ
INTRAVENOUS | Status: AC
  Filled 2022-05-18: qty 5

## 2022-05-18 MED ORDER — PROPOFOL 10 MG/ML IV BOLUS
INTRAVENOUS | Status: AC
  Filled 2022-05-18: qty 20

## 2022-05-18 MED ORDER — PROPOFOL 10 MG/ML IV BOLUS
INTRAVENOUS | Status: DC | PRN
  Administered 2022-05-18: 200 mg via INTRAVENOUS

## 2022-05-18 MED ORDER — ORAL CARE MOUTH RINSE: 15.0000 mL | Freq: Once | OROMUCOSAL | Status: AC

## 2022-05-18 MED ORDER — BUPIVACAINE LIPOSOME 1.3 % IJ SUSP
INTRAMUSCULAR | Status: DC | PRN
  Administered 2022-05-18: 10 mL via PERINEURAL

## 2022-05-18 MED ORDER — VANCOMYCIN HCL 1000 MG IV SOLR
INTRAVENOUS | Status: AC
  Filled 2022-05-18: qty 20

## 2022-05-18 MED ORDER — FENTANYL CITRATE (PF) 100 MCG/2ML IJ SOLN
INTRAMUSCULAR | Status: DC | PRN
  Administered 2022-05-18 (×2): 50 ug via INTRAVENOUS

## 2022-05-18 MED ORDER — MIDAZOLAM HCL 5 MG/5ML IJ SOLN
INTRAMUSCULAR | Status: DC | PRN
  Administered 2022-05-18: 1 mg via INTRAVENOUS

## 2022-05-18 MED ORDER — ONDANSETRON HCL 4 MG/2ML IJ SOLN
INTRAMUSCULAR | Status: DC | PRN
  Administered 2022-05-18: 4 mg via INTRAVENOUS

## 2022-05-18 MED ORDER — LACTATED RINGERS IV SOLN: INTRAVENOUS | Status: DC

## 2022-05-18 MED ORDER — VANCOMYCIN HCL 1000 MG IV SOLR
INTRAVENOUS | Status: DC | PRN
  Administered 2022-05-18: 1000 mg via TOPICAL

## 2022-05-18 MED ORDER — ONDANSETRON HCL 4 MG/2ML IJ SOLN
INTRAMUSCULAR | Status: AC
  Filled 2022-05-18: qty 6

## 2022-05-18 MED ORDER — DEXAMETHASONE SODIUM PHOSPHATE 10 MG/ML IJ SOLN
INTRAMUSCULAR | Status: AC
  Filled 2022-05-18: qty 3

## 2022-05-18 MED ORDER — CEFAZOLIN SODIUM-DEXTROSE 2-4 GM/100ML-% IV SOLN
2.0000 g | INTRAVENOUS | Status: AC
  Administered 2022-05-18: 2 g via INTRAVENOUS
  Filled 2022-05-18: qty 100

## 2022-05-18 MED ORDER — ROCURONIUM BROMIDE 10 MG/ML (PF) SYRINGE
PREFILLED_SYRINGE | INTRAVENOUS | Status: AC
  Filled 2022-05-18: qty 10

## 2022-05-18 MED ORDER — ROCURONIUM BROMIDE 10 MG/ML (PF) SYRINGE
PREFILLED_SYRINGE | INTRAVENOUS | Status: DC | PRN
  Administered 2022-05-18: 20 mg via INTRAVENOUS
  Administered 2022-05-18: 60 mg via INTRAVENOUS

## 2022-05-18 MED ORDER — MIDAZOLAM HCL 2 MG/2ML IJ SOLN
INTRAMUSCULAR | Status: AC
  Filled 2022-05-18: qty 2

## 2022-05-18 MED ORDER — EPHEDRINE SULFATE-NACL 50-0.9 MG/10ML-% IV SOSY
PREFILLED_SYRINGE | INTRAVENOUS | Status: DC | PRN
  Administered 2022-05-18 (×2): 10 mg via INTRAVENOUS

## 2022-05-18 MED ORDER — PHENYLEPHRINE HCL-NACL 20-0.9 MG/250ML-% IV SOLN
INTRAVENOUS | Status: DC | PRN
  Administered 2022-05-18: 45 ug/min via INTRAVENOUS

## 2022-05-18 MED ORDER — LIDOCAINE HCL (PF) 2 % IJ SOLN
INTRAMUSCULAR | Status: AC
  Filled 2022-05-18: qty 15

## 2022-05-18 MED ORDER — OXYCODONE HCL 5 MG PO TABS: 5.0000 mg | ORAL_TABLET | Freq: Once | ORAL | Status: DC | PRN

## 2022-05-18 MED ORDER — OXYCODONE HCL 5 MG/5ML PO SOLN: 5.0000 mg | Freq: Once | ORAL | Status: DC | PRN

## 2022-05-18 MED ORDER — ONDANSETRON HCL 4 MG/2ML IJ SOLN: 4.0000 mg | Freq: Four times a day (QID) | INTRAMUSCULAR | Status: DC | PRN

## 2022-05-18 MED ORDER — ONDANSETRON HCL 4 MG PO TABS: 4.0000 mg | ORAL_TABLET | Freq: Three times a day (TID) | ORAL | 0 refills | Status: AC | PRN

## 2022-05-18 MED ORDER — TRANEXAMIC ACID-NACL 1000-0.7 MG/100ML-% IV SOLN
1000.0000 mg | INTRAVENOUS | Status: AC
  Administered 2022-05-18: 1000 mg via INTRAVENOUS
  Filled 2022-05-18: qty 100

## 2022-05-18 MED ORDER — BUPIVACAINE HCL (PF) 0.5 % IJ SOLN
INTRAMUSCULAR | Status: DC | PRN
  Administered 2022-05-18: 15 mL via PERINEURAL

## 2022-05-18 MED ORDER — CHLORHEXIDINE GLUCONATE 0.12 % MT SOLN
15.0000 mL | Freq: Once | OROMUCOSAL | Status: AC
  Administered 2022-05-18: 15 mL via OROMUCOSAL

## 2022-05-18 MED ORDER — FENTANYL CITRATE (PF) 100 MCG/2ML IJ SOLN
INTRAMUSCULAR | Status: AC
  Filled 2022-05-18: qty 2

## 2022-05-18 MED ORDER — 0.9 % SODIUM CHLORIDE (POUR BTL) OPTIME
TOPICAL | Status: DC | PRN
  Administered 2022-05-18 (×2): 1000 mL

## 2022-05-18 MED ORDER — LIDOCAINE 2% (20 MG/ML) 5 ML SYRINGE
INTRAMUSCULAR | Status: DC | PRN
  Administered 2022-05-18: 60 mg via INTRAVENOUS

## 2022-05-18 MED ORDER — FENTANYL CITRATE PF 50 MCG/ML IJ SOSY: 25.0000 ug | PREFILLED_SYRINGE | INTRAMUSCULAR | Status: DC | PRN

## 2022-05-18 MED ORDER — OXYCODONE-ACETAMINOPHEN 5-325 MG PO TABS: 1.0000 | ORAL_TABLET | ORAL | 0 refills | Status: AC | PRN

## 2022-05-18 SURGICAL SUPPLY — 65 items
AID PSTN UNV HD RSTRNT DISP (MISCELLANEOUS)
BAG COUNTER SPONGE SURGICOUNT (BAG) IMPLANT
BAG SPEC THK2 15X12 ZIP CLS (MISCELLANEOUS) ×1
BAG SPNG CNTER NS LX DISP (BAG)
BAG ZIPLOCK 12X15 (MISCELLANEOUS) ×2 IMPLANT
BLADE SAG 18X100X1.27 (BLADE) ×2 IMPLANT
BSPLAT GLND +2X24 MDLR (Joint) ×1 IMPLANT
CALIBRATOR GLENOID VIP 5-D (SYSTAGENIX WOUND MANAGEMENT) IMPLANT
COVER BACK TABLE 60X90IN (DRAPES) ×2 IMPLANT
COVER SURGICAL LIGHT HANDLE (MISCELLANEOUS) ×2 IMPLANT
CUP SUT UNIV REVERS 39 NEU (Shoulder) IMPLANT
DRAPE ORTHO SPLIT 77X108 STRL (DRAPES) ×2
DRAPE SHEET LG 3/4 BI-LAMINATE (DRAPES) ×2 IMPLANT
DRAPE SURG 17X11 SM STRL (DRAPES) ×2 IMPLANT
DRAPE SURG ORHT 6 SPLT 77X108 (DRAPES) ×4 IMPLANT
DRAPE TOP 10253 STERILE (DRAPES) ×2 IMPLANT
DRAPE U-SHAPE 47X51 STRL (DRAPES) ×2 IMPLANT
DRSG AQUACEL AG ADV 3.5X 6 (GAUZE/BANDAGES/DRESSINGS) IMPLANT
DRSG AQUACEL AG ADV 3.5X10 (GAUZE/BANDAGES/DRESSINGS) ×2 IMPLANT
DURAPREP 26ML APPLICATOR (WOUND CARE) IMPLANT
ELECT REM PT RETURN 15FT ADLT (MISCELLANEOUS) ×2 IMPLANT
FACESHIELD WRAPAROUND (MASK) ×1 IMPLANT
FACESHIELD WRAPAROUND OR TEAM (MASK) ×2 IMPLANT
GLENOID UNI REV MOD 24 +2 LAT (Joint) IMPLANT
GLENOSPHERE 39+4 LAT/24 UNI RV (Joint) IMPLANT
GLOVE BIO SURGEON STRL SZ7.5 (GLOVE) ×8 IMPLANT
GLOVE BIOGEL PI IND STRL 8 (GLOVE) ×4 IMPLANT
GOWN STRL REUS W/ TWL XL LVL3 (GOWN DISPOSABLE) ×4 IMPLANT
GOWN STRL REUS W/TWL XL LVL3 (GOWN DISPOSABLE) ×2
INSERT HUMERAL M/39 +3/CNSTRND (Miscellaneous) IMPLANT
KIT BASIN OR (CUSTOM PROCEDURE TRAY) ×2 IMPLANT
KIT TURNOVER KIT A (KITS) IMPLANT
MANIFOLD NEPTUNE II (INSTRUMENTS) ×2 IMPLANT
NDL TAPERED W/ NITINOL LOOP (MISCELLANEOUS) IMPLANT
NEEDLE TAPERED W/ NITINOL LOOP (MISCELLANEOUS) IMPLANT
NS IRRIG 1000ML POUR BTL (IV SOLUTION) ×2 IMPLANT
PACK SHOULDER (CUSTOM PROCEDURE TRAY) ×2 IMPLANT
PROTECTOR NERVE ULNAR (MISCELLANEOUS) ×2 IMPLANT
RESTRAINT HEAD UNIVERSAL NS (MISCELLANEOUS) IMPLANT
SCREW CENTRAL MOD 35 (Screw) IMPLANT
SCREW PERI LOCK 5.5X16 (Screw) IMPLANT
SCREW PERI LOCK 5.5X32 (Screw) IMPLANT
SCREW PERIPHERAL 5.5X20 LOCK (Screw) IMPLANT
SLING ARM FOAM STRAP MED (SOFTGOODS) IMPLANT
SLING ARM IMMOBILIZER LRG (SOFTGOODS) IMPLANT
SMARTMIX MINI TOWER (MISCELLANEOUS)
SPACER SHLD UNI REV 39 +6 (Shoulder) IMPLANT
SPONGE T-LAP 4X18 ~~LOC~~+RFID (SPONGE) IMPLANT
STEM HUMERAL UNIVER REV SIZE 7 (Stem) IMPLANT
STRIP CLOSURE SKIN 1/2X4 (GAUZE/BANDAGES/DRESSINGS) ×2 IMPLANT
SUCTION FRAZIER HANDLE 10FR (MISCELLANEOUS) ×1
SUCTION TUBE FRAZIER 10FR DISP (MISCELLANEOUS) ×2 IMPLANT
SUT FIBERWIRE #2 38 T-5 BLUE (SUTURE)
SUT MON AB 3-0 SH 27 (SUTURE) ×1
SUT MON AB 3-0 SH27 (SUTURE) ×2 IMPLANT
SUT VIC AB 0 CT1 36 (SUTURE) ×2 IMPLANT
SUT VIC AB 1 CT1 36 (SUTURE) ×2 IMPLANT
SUT VIC AB 2-0 CT1 27 (SUTURE) ×1
SUT VIC AB 2-0 CT1 TAPERPNT 27 (SUTURE) ×2 IMPLANT
SUTURE FIBERWR #2 38 T-5 BLUE (SUTURE) IMPLANT
SUTURE TAPE 1.3 40 TPR END (SUTURE) ×4 IMPLANT
SUTURETAPE 1.3 40 TPR END (SUTURE) ×2
TOWEL OR 17X26 10 PK STRL BLUE (TOWEL DISPOSABLE) ×2 IMPLANT
TOWER SMARTMIX MINI (MISCELLANEOUS) IMPLANT
TUBE SUCTION HIGH CAP CLEAR NV (SUCTIONS) ×2 IMPLANT

## 2022-05-18 NOTE — Progress Notes (Signed)
Phase II

## 2022-05-18 NOTE — Transfer of Care (Signed)
Immediate Anesthesia Transfer of Care Note  Patient: Ian Park  Procedure(s) Performed: Procedure(s) with comments: REVERSE SHOULDER ARTHROPLASTY (Right) - 150  Patient Location: PACU  Anesthesia Type:GA combined with regional for post-op pain  Level of Consciousness:  sedated, patient cooperative and responds to stimulation  Airway & Oxygen Therapy:Patient Spontanous Breathing and Patient connected to face mask oxgen  Post-op Assessment:  Report given to PACU RN and Post -op Vital signs reviewed and stable  Post vital signs:  Reviewed and stable  Last Vitals:  Vitals:   05/18/22 0538 05/18/22 0924  BP: (!) 148/85   Pulse: 75   Resp: 15   Temp: 36.6 C 36.5 C  SpO2: 21%     Complications: No apparent anesthesia complications

## 2022-05-18 NOTE — Discharge Instructions (Signed)
Orthopedic surgery discharge instructions:  -Maintain postoperative bandage until follow-up appointment.  This is waterproof, and you may begin showering on postoperative day #3.  Do not submerge underwater.  Maintain that bandage until your follow-up appointment in 2 weeks.  -No lifting over 2 pounds with operateive arm.  You may use the arm immediately for activities of daily living such as bathing, washing your face and brushing your teeth, eating, and getting dressed.  Otherwise maintain your sling when you are out of the house and sleeping.  -Apply ice liberally to the shoulder throughout the day.  For mild to moderate pain use Tylenol and Advil as needed around-the-clock.  For breakthrough pain use oxycodone as necessary.  -Take an 81 mg aspirin once per day x 6 weeks for prevention of blood clots.  -You will return to see Dr. Stann Mainland in the office in 2 weeks for routine postoperative check with x-rays.

## 2022-05-18 NOTE — H&P (Signed)
ORTHOPAEDIC H&P  REQUESTING PHYSICIAN: Nicholes Stairs, MD  PCP:  [name], MD  Chief Complaint: Right shoulder osteoarthritis  HPI: Ian Park is a 71 y.o. male who complains of right shoulder pain and stiffness.  He has had conservative treatment thus far with injections and therapy.  He continues with significant stiffness and pain.  Ultimately he has failed conservative treatment and is here today for total shoulder arthroplasty.  No new complaints at this time.  Past Medical History:  Diagnosis Date   Arthritis    oa, hips and right shoulder   Birthmark    right leg portwine stain birthmark   Cancer ([place])    lymphoma- tx. last with chemo 2011-currently remission-Dr. Keene Breath   Complication of anesthesia    "slow to wake up and PEE"   Headache    occ. stress related   History of chemotherapy 2011   none currently   History of kidney stones    x1 episode. 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   Pre-diabetes    Prostate cancer ([place])    Shin injury    red area no drainage pt says present for last 30 years   Past Surgical History:  Procedure Laterality Date   KNEE ARTHROSCOPY  1982   Right knee   LYMPHADENECTOMY Bilateral 12/22/2015   Procedure: BILATERAL PELVIC LYMPHADENECTOMY;  Surgeon: Raynelle Bring, MD;  Location: [facility];  Service: Urology;  Laterality: Bilateral;   ROBOT ASSISTED LAPAROSCOPIC RADICAL PROSTATECTOMY N/A 12/22/2015   Procedure: XI ROBOTIC ASSISTED LAPAROSCOPIC RADICAL PROSTATECTOMY LEVEL 3, AND REMOVAL OF BLADDER CALCULUS;  Surgeon: Raynelle Bring, MD;  Location: [facility];  Service: Urology;  Laterality: N/A;   ROTATOR CUFF REPAIR  1997   Right shoulder    SHOULDER ARTHROSCOPY  1986   Right shoulder   SHOULDER ARTHROSCOPY  1993   Left shoulder Rotator cuff repair   TOTAL HIP ARTHROPLASTY Left 05/30/2015   Procedure: LEFT TOTAL HIP  ARTHROPLASTY ANTERIOR APPROACH;  Surgeon: Gaynelle Arabian, MD;  Location: [facility];  Service: Orthopedics;  Laterality: Left;   TOTAL HIP ARTHROPLASTY Right 10/30/2017   Procedure: RIGHT TOTAL HIP ARTHROPLASTY ANTERIOR APPROACH;  Surgeon: Gaynelle Arabian, MD;  Location: [facility];  Service: Orthopedics;  Laterality: Right;   Social History   Socioeconomic History   Marital status: Significant Other    Spouse name: Not on file   Number of children: Not on file   Years of education: Not on file   Highest education level: Not on file  Occupational History   Occupation: Works at [employer] in [place], [place]    Employer: [employer]  Tobacco Use   Smoking status: Former    Packs/day: 1.00    Years: 10.00    Total pack years: 10.00    Types: Cigars, Pipe, Cigarettes    Quit date: 06/11/2004    Years since quitting: 17.9   Smokeless tobacco: Never  Vaping Use   Vaping Use: Never used  Substance and Sexual Activity   Alcohol use: Yes    Alcohol/week: 5.0 standard drinks of alcohol    Types: 5 Shots of liquor per week    Comment: 1 drink per day   Drug use: No   Sexual activity: Not Currently  Other Topics Concern   Not on file  Social History Narrative   Not on file   Social  Determinants of Health   Financial Resource Strain: Not on file  Food Insecurity: Not on file  Transportation Needs: Not on file  Physical Activity: Not on file  Stress: Not on file  Social Connections: Not on file   Family History  Problem Relation Age of Onset   Cataracts Mother    Heart attack Father    Cholelithiasis Brother    Cancer Neg Hx    Prostate cancer Neg Hx    Colon cancer Neg Hx    Breast cancer Neg Hx    No Known Allergies Prior to Admission medications   Medication Sig Start Date End Date Taking? Authorizing Provider  acetaminophen (TYLENOL) 500 MG tablet Take 1,000 mg by mouth every 8 (eight) hours as needed for moderate pain.   Yes [provider]  atorvastatin  (LIPITOR) 20 MG tablet Take 20 mg by mouth daily.     Yes [provider]  cetirizine (ZYRTEC) 10 MG tablet Take 10 mg by mouth daily.     Yes [provider]  Cholecalciferol (VITAMIN D) 2000 units CAPS Take 2,000 Units by mouth daily.   Yes [provider]  diclofenac Sodium (VOLTAREN) 1 % GEL Apply 1 Application topically 2 (two) times daily as needed (PAIN).   Yes [provider]  diphenhydrAMINE (BENADRYL) 25 MG tablet Take 25 mg by mouth at bedtime.   Yes [provider]  fluticasone (FLONASE) 50 MCG/ACT nasal spray Place 2 sprays into both nostrils daily.   Yes [provider]  ibuprofen (ADVIL,MOTRIN) 200 MG tablet Take 400 mg by mouth 2 (two) times daily as needed.   Yes [provider]  Multiple Vitamin (MULTIVITAMIN) tablet Take 0.5 tablets by mouth 2 (two) times daily.   Yes [provider]  Semaglutide (OZEMPIC, 0.25 OR 0.5 MG/DOSE, [place]) Inject 0.5 mg into the skin once a week. Mondays   Yes [provider]  tadalafil (CIALIS) 5 MG tablet Take 5 mg by mouth daily.   Yes [provider]  traZODone (DESYREL) 100 MG tablet Take 50 mg by mouth at bedtime. 02/09/19  Yes [provider]  vitamin B-12 (CYANOCOBALAMIN) 500 MCG tablet Take 500 mcg by mouth daily.   Yes [provider]  traMADol (ULTRAM) 50 MG tablet Take 1-2 tablets (50-100 mg total) by mouth every 6 (six) hours as needed for moderate pain (not relieved by oxycodone). 10/31/17   [place], Museum/gallery conservator, PA-C  Zoster Vaccine Adjuvanted [facility]) injection Shingrix (PF) 50 mcg/0.5 mL intramuscular suspension, kit    [provider]   No results found.  Positive ROS: All other systems have been reviewed and were otherwise negative with the exception of those mentioned in the HPI and as above.  Physical Exam: General: Alert, no acute distress Cardiovascular: No pedal edema Respiratory: No cyanosis, no use of accessory  musculature GI: No organomegaly, abdomen is soft and non-tender Skin: No lesions in the area of chief complaint Neurologic: Sensation intact distally Psychiatric: Patient is competent for consent with normal mood and affect Lymphatic: No axillary or cervical lymphadenopathy  MUSCULOSKELETAL: Right upper extremity is warm and well-perfused with no open wounds or lesions.  Assessment: Right shoulder end-stage osteoarthritis  Plan: Plan to proceed with total shoulder arthroplasty today.  We again discussed that we would go with a reverse arthroplasty given his degree of deformity and history of previous rotator cuff surgeries.  We discussed the risk of bleeding, infection, damage to surrounding nerves and vessels, stiffness, failure of pain  relief, dislocation, fracture, and the risk of anesthesia.  He has provided informed consent.    Nicholes Stairs, MD Cell [phone]    05/18/2022 7:05 AM

## 2022-05-18 NOTE — Op Note (Signed)
05/18/2022  9:17 AM  PATIENT:  Ian Park    PRE-OPERATIVE DIAGNOSIS:  right shoulder osteoarthritis  POST-OPERATIVE DIAGNOSIS:  Same  PROCEDURE: Right REVERSE SHOULDER ARTHROPLASTY  SURGEON:  Nicholes Stairs, MD  ASSISTANT: Jonelle Sidle, PA-C  Assistant attestation:  PA McClung present for the entire procedure.  ANESTHESIA:   General  ESTIMATED BLOOD LOSS: 200 cc  PREOPERATIVE INDICATIONS:  Ian Park is a  71 y.o. male with a diagnosis of right shoulder osteoarthritis who failed conservative measures and elected for surgical management.    The risks benefits and alternatives were discussed with the patient preoperatively including but not limited to the risks of infection, bleeding, nerve injury, cardiopulmonary complications, the need for revision surgery, dislocation, brachial plexus palsy, incomplete relief of pain, among others, and the patient was willing to proceed.  OPERATIVE IMPLANTS:   Arthrex universe reverse system with a size 7 stem Glenoid baseplate 24 mm +2 with a 35 mm central compression screw and 4 peripheral locking screws 39 mm +4 mm lateralized glenosphere. +6 humeral tray with a 3 constrained liner   OPERATIVE FINDINGS: End-stage osteoarthritis with large goats beard osteophyte inferiorly as well as B2 glenoid with minimal retroversion however.  Long head of biceps was flattened and longitudinally split but intact.  Rotator cuff musculature was intact x 4.  OPERATIVE PROCEDURE: The patient was brought to the operating room and placed in the supine position. General anesthesia was administered. IV antibiotics were given. A Foley was not placed. Time out was performed. The upper extremity was prepped and draped in usual sterile fashion. The patient was in a beachchair position. Deltopectoral approach was carried out. The biceps was tenodesed to the pectoralis tendon with #2 Fiberwire. The subscapularis was released off of the bone.   I then  performed circumferential releases of the humerus, and then dislocated the head, and then reamed with the reamer to the above named size.  I then applied the jig, and cut the humeral head in 30 of retroversion, and then turned my attention to the glenoid.  Deep retractors were placed, and I resected the labrum, and then placed a guidepin into the center position on the glenoid, with slight inferior inclination. I then reamed over the guidepin, and this created a small metaphyseal cancellus blush inferiorly, removing just the cartilage to the subchondral bone superiorly. The base plate was selected and screwed place, and then I secured it centrally with a nonlocking screw, and I had excellent purchase both inferiorly and superiorly. I placed a short locking screws on anterior and posterior aspects.  I then turned my attention to the glenosphere, and impacted this into place.  The glenoid sphere was completely seated, and had engagement of the [facility] taper. I then turned my attention back to the humerus.  I sequentially broached, and then trialed, and was found to restore soft tissue tension, and it had 2 finger tightness. Therefore the above named components were selected. The shoulder felt stable throughout functional motion.   I then impacted the real prosthesis into place, as well as the real humeral tray, and reduced the shoulder. The shoulder had excellent motion, and was stable, and I irrigated the wounds copiously.   We then repaired the subscapularis back to the suture lock suture cup on the anterior aspect of the humerus.  I then irrigated the shoulder copiously once more, repaired the deltopectoral interval with #2 FiberWire followed by subcutaneous Vicryl, then monocryl for the skin,  with Steri-Strips and  sterile gauze for the skin. The patient was awakened and returned back in stable and satisfactory condition. There no complications and they tolerated the procedure well.  All counts  were correct x2.   Disposition:  Ian Park will be nonweightbearing for the next 6 weeks other than activities of daily living to the right upper extremity.  He will be in a sling for 2 weeks.  He can begin activities of daily living immediately.  He will follow-up with me in 2 weeks with x-rays to the right arm, 2 views right shoulder.  He will take 81 mg aspirin once per day x 6 weeks.

## 2022-05-18 NOTE — Anesthesia Preprocedure Evaluation (Signed)
Anesthesia Evaluation  Patient identified by MRN, date of birth, ID band Patient awake    Reviewed: Allergy & Precautions, H&P , NPO status , Patient's Chart, lab work & pertinent test results  Airway Mallampati: II   Neck ROM: full    Dental   Pulmonary former smoker   breath sounds clear to auscultation       Cardiovascular negative cardio ROS  Rhythm:regular Rate:Normal     Neuro/Psych  Headaches    GI/Hepatic   Endo/Other    Renal/GU stones     Musculoskeletal  (+) Arthritis ,    Abdominal   Peds  Hematology   Anesthesia Other Findings   Reproductive/Obstetrics                             Anesthesia Physical Anesthesia Plan  ASA: 3  Anesthesia Plan: General   Post-op Pain Management: Regional block*   Induction: Intravenous  PONV Risk Score and Plan: 2 and Ondansetron, Dexamethasone, Midazolam and Treatment may vary due to age or medical condition  Airway Management Planned: Oral ETT  Additional Equipment:   Intra-op Plan:   Post-operative Plan: Extubation in OR  Informed Consent: I have reviewed the patients History and Physical, chart, labs and discussed the procedure including the risks, benefits and alternatives for the proposed anesthesia with the patient or authorized representative who has indicated his/her understanding and acceptance.     Dental advisory given  Plan Discussed with: CRNA, Anesthesiologist and Surgeon  Anesthesia Plan Comments:        Anesthesia Quick Evaluation

## 2022-05-18 NOTE — Anesthesia Procedure Notes (Signed)
Anesthesia Regional Block: Interscalene brachial plexus block   Pre-Anesthetic Checklist: , timeout performed,  Correct Patient, Correct Site, Correct Laterality,  Correct Procedure, Correct Position, site marked,  Risks and benefits discussed,  Surgical consent,  Pre-op evaluation,  At surgeon's request and post-op pain management  Laterality: Right  Prep: chloraprep       Needles:  Injection technique: Single-shot  Needle Type: Echogenic Stimulator Needle     Needle Length: 5cm  Needle Gauge: 22     Additional Needles:   Procedures:, nerve stimulator,,,,,     Nerve Stimulator or Paresthesia:  Response: biceps flexion, 0.45 mA  Additional Responses:   Narrative:  Start time: 05/18/2022 7:00 AM End time: 05/18/2022 7:10 AM Injection made incrementally with aspirations every 5 mL.  Performed by: Personally  Anesthesiologist: Albertha Ghee, MD  Additional Notes: Functioning IV was confirmed and monitors were applied.  A 68m 22ga Arrow echogenic stimulator needle was used. Sterile prep and drape,hand hygiene and sterile gloves were used.  Negative aspiration and negative test dose prior to incremental administration of local anesthetic. The patient tolerated the procedure well.  Ultrasound guidance: relevent anatomy identified, needle position confirmed, local anesthetic spread visualized around nerve(s), vascular puncture avoided.  Image printed for medical record.

## 2022-05-18 NOTE — Brief Op Note (Signed)
05/18/2022  9:15 AM  PATIENT:  Ian Park  71 y.o. male  PRE-OPERATIVE DIAGNOSIS:  right shoulder osteoarthritis  POST-OPERATIVE DIAGNOSIS:  right shoulder osteoarthritis  PROCEDURE:  Procedure(s) with comments: REVERSE SHOULDER ARTHROPLASTY (Right) - 150  SURGEON:  Surgeon(s) and Role:    * Stann Mainland, Elly Modena, MD - Primary   Physician assistant: Jonelle Sidle, PA-C   ANESTHESIA:   regional and general  EBL:  200 mL   BLOOD ADMINISTERED:none  DRAINS: none   LOCAL MEDICATIONS USED:  NONE  SPECIMEN:  No Specimen  DISPOSITION OF SPECIMEN:  N/A  COUNTS:  YES  TOURNIQUET:  * No tourniquets in log *  DICTATION: .Note written in EPIC  PLAN OF CARE: Discharge to home after PACU  PATIENT DISPOSITION:  PACU - hemodynamically stable.   Delay start of Pharmacological VTE agent (>24hrs) due to surgical blood loss or risk of bleeding: not applicable

## 2022-05-18 NOTE — Anesthesia Procedure Notes (Signed)
Procedure Name: Intubation Date/Time: 05/18/2022 7:40 AM  Performed by: Lavina Hamman, CRNAPre-anesthesia Checklist: Patient identified, Emergency Drugs available, Suction available, Patient being monitored and Timeout performed Patient Re-evaluated:Patient Re-evaluated prior to induction Oxygen Delivery Method: Circle system utilized Preoxygenation: Pre-oxygenation with 100% oxygen Induction Type: IV induction Ventilation: Mask ventilation without difficulty and Oral airway inserted - appropriate to patient size Laryngoscope Size: Mac and 3 Grade View: Grade I Tube type: Oral Tube size: 7.5 mm Number of attempts: 1 Airway Equipment and Method: Stylet Placement Confirmation: ETT inserted through vocal cords under direct vision, positive ETCO2, CO2 detector and breath sounds checked- equal and bilateral Secured at: 23 cm Tube secured with: Tape Dental Injury: Teeth and Oropharynx as per pre-operative assessment  Comments: ATOI

## 2022-05-20 NOTE — Anesthesia Postprocedure Evaluation (Signed)
Anesthesia Post Note  Patient: Ian Park  Procedure(s) Performed: REVERSE SHOULDER ARTHROPLASTY (Right: Shoulder)     Patient location during evaluation: PACU Anesthesia Type: General and Regional Level of consciousness: awake and alert Pain management: pain level controlled Vital Signs Assessment: post-procedure vital signs reviewed and stable Respiratory status: spontaneous breathing, nonlabored ventilation, respiratory function stable and patient connected to nasal cannula oxygen Cardiovascular status: blood pressure returned to baseline and stable Postop Assessment: no apparent nausea or vomiting Anesthetic complications: no   No notable events documented.  Last Vitals:  Vitals:   05/18/22 1100 05/18/22 1115  BP: (!) 158/91 (!) 151/88  Pulse: 81 88  Resp: (!) 24 (!) 22  Temp:    SpO2: 93% 94%    Last Pain:  Vitals:   05/18/22 1115  TempSrc:   PainSc: 0-No pain                 Shantele Reller S

## 2022-05-24 ENCOUNTER — Encounter (HOSPITAL_COMMUNITY): Payer: Self-pay | Admitting: Orthopedic Surgery

## 2022-12-25 ENCOUNTER — Other Ambulatory Visit: Payer: Self-pay | Admitting: Physician Assistant

## 2022-12-25 DIAGNOSIS — N62 Hypertrophy of breast: Secondary | ICD-10-CM

## 2023-02-13 ENCOUNTER — Ambulatory Visit: Payer: No Typology Code available for payment source

## 2023-02-13 ENCOUNTER — Ambulatory Visit
Admission: RE | Admit: 2023-02-13 | Discharge: 2023-02-13 | Disposition: A | Discharge status: Home / Self Care | Payer: No Typology Code available for payment source | Source: Ambulatory Visit | Attending: Physician Assistant | Admitting: Physician Assistant

## 2023-02-13 DIAGNOSIS — N62 Hypertrophy of breast: Secondary | ICD-10-CM

## 2023-03-26 ENCOUNTER — Other Ambulatory Visit (HOSPITAL_COMMUNITY): Payer: Self-pay

## 2023-03-26 MED ORDER — WEGOVY 0.5 MG/0.5ML ~~LOC~~ SOAJ
0.5000 mg | SUBCUTANEOUS | 0 refills | Status: AC
  Filled 2023-03-26: qty 2, 28d supply, fill #0

## 2023-03-28 ENCOUNTER — Other Ambulatory Visit (HOSPITAL_COMMUNITY): Payer: Self-pay

## 2023-05-06 ENCOUNTER — Other Ambulatory Visit (HOSPITAL_COMMUNITY): Payer: Self-pay

## 2023-05-08 ENCOUNTER — Other Ambulatory Visit (HOSPITAL_COMMUNITY): Payer: Self-pay

## 2023-05-08 ENCOUNTER — Other Ambulatory Visit: Payer: Self-pay

## 2023-05-08 MED ORDER — WEGOVY 0.5 MG/0.5ML ~~LOC~~ SOAJ
0.5000 mL | SUBCUTANEOUS | 0 refills | Status: DC
  Filled 2023-05-08 (×2): qty 2, 28d supply, fill #0

## 2023-05-16 ENCOUNTER — Other Ambulatory Visit (HOSPITAL_COMMUNITY): Payer: Self-pay

## 2023-06-10 ENCOUNTER — Other Ambulatory Visit (HOSPITAL_COMMUNITY): Payer: Self-pay

## 2023-06-10 ENCOUNTER — Other Ambulatory Visit (HOSPITAL_BASED_OUTPATIENT_CLINIC_OR_DEPARTMENT_OTHER): Payer: Self-pay

## 2023-06-10 MED ORDER — WEGOVY 0.5 MG/0.5ML ~~LOC~~ SOAJ
0.5000 mg | SUBCUTANEOUS | 0 refills | Status: AC
  Filled 2023-06-10: qty 6, 84d supply, fill #0

## 2023-06-11 ENCOUNTER — Other Ambulatory Visit (HOSPITAL_COMMUNITY): Payer: Self-pay

## 2023-06-19 ENCOUNTER — Other Ambulatory Visit (HOSPITAL_COMMUNITY): Payer: Self-pay

## 2023-08-03 ENCOUNTER — Other Ambulatory Visit (HOSPITAL_COMMUNITY): Payer: Self-pay

## 2023-08-29 ENCOUNTER — Other Ambulatory Visit (HOSPITAL_COMMUNITY): Payer: Self-pay

## 2023-08-29 MED ORDER — ATORVASTATIN CALCIUM 20 MG PO TABS
20.0000 mg | ORAL_TABLET | Freq: Every day | ORAL | 3 refills | Status: AC
  Filled 2023-08-29: qty 90, 90d supply, fill #0
  Filled 2024-01-08: qty 90, 90d supply, fill #1
  Filled 2024-03-18: qty 90, 90d supply, fill #2
  Filled 2024-07-01: qty 90, 90d supply, fill #3

## 2023-08-29 MED ORDER — WEGOVY 1 MG/0.5ML ~~LOC~~ SOAJ
1.0000 mg | SUBCUTANEOUS | 0 refills | Status: AC
  Filled 2023-08-29 – 2023-09-03 (×2): qty 2, 28d supply, fill #0
  Filled 2023-10-01: qty 2, 28d supply, fill #1
  Filled 2023-10-28: qty 2, 28d supply, fill #2

## 2023-08-30 ENCOUNTER — Other Ambulatory Visit (HOSPITAL_COMMUNITY): Payer: Self-pay

## 2023-09-03 ENCOUNTER — Other Ambulatory Visit (HOSPITAL_COMMUNITY): Payer: Self-pay

## 2023-10-01 ENCOUNTER — Encounter (HOSPITAL_COMMUNITY): Payer: Self-pay

## 2023-10-01 ENCOUNTER — Other Ambulatory Visit (HOSPITAL_COMMUNITY): Payer: Self-pay

## 2023-10-28 ENCOUNTER — Other Ambulatory Visit (HOSPITAL_COMMUNITY): Payer: Self-pay

## 2023-11-05 ENCOUNTER — Other Ambulatory Visit (HOSPITAL_COMMUNITY): Payer: Self-pay

## 2023-11-05 MED ORDER — TADALAFIL 5 MG PO TABS
5.0000 mg | ORAL_TABLET | Freq: Every day | ORAL | 3 refills | Status: AC
  Filled 2023-11-05 – 2023-11-14 (×2): qty 30, 30d supply, fill #0

## 2023-11-14 ENCOUNTER — Other Ambulatory Visit (HOSPITAL_COMMUNITY): Payer: Self-pay

## 2023-12-04 ENCOUNTER — Other Ambulatory Visit (HOSPITAL_COMMUNITY): Payer: Self-pay

## 2023-12-04 MED ORDER — WEGOVY 1.7 MG/0.75ML ~~LOC~~ SOAJ
1.7000 mg | SUBCUTANEOUS | 0 refills | Status: DC
  Filled 2023-12-04: qty 3, 28d supply, fill #0

## 2024-01-01 ENCOUNTER — Other Ambulatory Visit (HOSPITAL_COMMUNITY): Payer: Self-pay

## 2024-01-02 ENCOUNTER — Other Ambulatory Visit (HOSPITAL_COMMUNITY): Payer: Self-pay

## 2024-01-02 MED ORDER — WEGOVY 1.7 MG/0.75ML ~~LOC~~ SOAJ
1.7000 mg | SUBCUTANEOUS | 0 refills | Status: DC
  Filled 2024-01-02: qty 3, 28d supply, fill #0

## 2024-01-08 ENCOUNTER — Other Ambulatory Visit (HOSPITAL_COMMUNITY): Payer: Self-pay

## 2024-01-10 ENCOUNTER — Other Ambulatory Visit (HOSPITAL_BASED_OUTPATIENT_CLINIC_OR_DEPARTMENT_OTHER): Payer: Self-pay

## 2024-02-03 ENCOUNTER — Other Ambulatory Visit (HOSPITAL_COMMUNITY): Payer: Self-pay

## 2024-02-06 ENCOUNTER — Other Ambulatory Visit (HOSPITAL_COMMUNITY): Payer: Self-pay

## 2024-02-07 ENCOUNTER — Other Ambulatory Visit (HOSPITAL_COMMUNITY): Payer: Self-pay

## 2024-02-07 MED ORDER — WEGOVY 1.7 MG/0.75ML ~~LOC~~ SOAJ
1.7000 mg | SUBCUTANEOUS | 0 refills | Status: DC
  Filled 2024-02-07: qty 3, 28d supply, fill #0

## 2024-02-26 ENCOUNTER — Other Ambulatory Visit (HOSPITAL_COMMUNITY): Payer: Self-pay

## 2024-02-27 ENCOUNTER — Other Ambulatory Visit (HOSPITAL_COMMUNITY): Payer: Self-pay

## 2024-02-27 MED ORDER — WEGOVY 1.7 MG/0.75ML ~~LOC~~ SOAJ
1.7000 mg | SUBCUTANEOUS | 2 refills | Status: DC
  Filled 2024-02-27 – 2024-02-28 (×2): qty 3, 28d supply, fill #0
  Filled 2024-04-03: qty 3, 28d supply, fill #1
  Filled 2024-04-28: qty 3, 28d supply, fill #2

## 2024-02-28 ENCOUNTER — Other Ambulatory Visit (HOSPITAL_COMMUNITY): Payer: Self-pay

## 2024-03-18 ENCOUNTER — Other Ambulatory Visit (HOSPITAL_COMMUNITY): Payer: Self-pay

## 2024-04-03 ENCOUNTER — Other Ambulatory Visit (HOSPITAL_COMMUNITY): Payer: Self-pay

## 2024-04-29 ENCOUNTER — Other Ambulatory Visit (HOSPITAL_COMMUNITY): Payer: Self-pay

## 2024-05-06 ENCOUNTER — Other Ambulatory Visit (HOSPITAL_COMMUNITY): Payer: Self-pay

## 2024-05-06 MED ORDER — WEGOVY 1.7 MG/0.75ML ~~LOC~~ SOAJ
1.7000 mg | SUBCUTANEOUS | 2 refills | Status: AC
  Filled 2024-05-27: qty 3, 28d supply, fill #0
  Filled 2024-07-01: qty 3, 28d supply, fill #1

## 2024-05-27 ENCOUNTER — Other Ambulatory Visit: Payer: Self-pay

## 2024-06-22 ENCOUNTER — Other Ambulatory Visit (HOSPITAL_COMMUNITY): Payer: Self-pay

## 2024-06-22 ENCOUNTER — Encounter (HOSPITAL_COMMUNITY): Payer: Self-pay

## 2024-06-22 MED ORDER — WEGOVY 2.4 MG/0.75ML ~~LOC~~ SOAJ
2.4000 mg | SUBCUTANEOUS | 0 refills | Status: AC
  Filled 2024-06-22: qty 9, 84d supply, fill #0

## 2024-06-23 ENCOUNTER — Other Ambulatory Visit (HOSPITAL_COMMUNITY): Payer: Self-pay

## 2024-07-01 ENCOUNTER — Other Ambulatory Visit (HOSPITAL_COMMUNITY): Payer: Self-pay

## 2024-07-01 MED ORDER — TRAZODONE HCL 100 MG PO TABS
50.0000 mg | ORAL_TABLET | Freq: Every day | ORAL | 0 refills | Status: AC
  Filled 2024-07-01: qty 45, 90d supply, fill #0

## 2024-07-05 ENCOUNTER — Other Ambulatory Visit (HOSPITAL_COMMUNITY): Payer: Self-pay

## 2024-07-07 ENCOUNTER — Other Ambulatory Visit (HOSPITAL_COMMUNITY): Payer: Self-pay

## 2024-07-07 MED ORDER — WEGOVY 9 MG PO TABS
9.0000 mg | ORAL_TABLET | Freq: Every morning | ORAL | 0 refills | Status: DC
  Filled 2024-07-07: qty 30, 30d supply, fill #0

## 2024-07-08 ENCOUNTER — Other Ambulatory Visit: Payer: Self-pay

## 2024-07-08 ENCOUNTER — Other Ambulatory Visit (HOSPITAL_COMMUNITY): Payer: Self-pay

## 2024-07-08 MED ORDER — WEGOVY 4 MG PO TABS
4.0000 mg | ORAL_TABLET | Freq: Every morning | ORAL | 3 refills | Status: AC
  Filled 2024-07-08: qty 30, 30d supply, fill #0

## 2024-07-13 ENCOUNTER — Other Ambulatory Visit (HOSPITAL_COMMUNITY): Payer: Self-pay
